# Patient Record
Sex: Male | Born: 1965 | Race: White | Hispanic: No | Marital: Single | Smoking: Never smoker
Health system: Southern US, Community
[De-identification: ages and names within clinical notes are randomized; demographics above are authoritative.]

## PROBLEM LIST (undated history)

## (undated) DIAGNOSIS — I10 Essential (primary) hypertension: Secondary | ICD-10-CM

## (undated) DIAGNOSIS — F419 Anxiety disorder, unspecified: Secondary | ICD-10-CM

## (undated) DIAGNOSIS — E785 Hyperlipidemia, unspecified: Secondary | ICD-10-CM

## (undated) DIAGNOSIS — E119 Type 2 diabetes mellitus without complications: Secondary | ICD-10-CM

## (undated) DIAGNOSIS — R569 Unspecified convulsions: Secondary | ICD-10-CM

## (undated) DIAGNOSIS — K219 Gastro-esophageal reflux disease without esophagitis: Secondary | ICD-10-CM

## (undated) HISTORY — DX: Type 2 diabetes mellitus without complications: E11.9

## (undated) HISTORY — DX: Hyperlipidemia, unspecified: E78.5

## (undated) HISTORY — DX: Gastro-esophageal reflux disease without esophagitis: K21.9

## (undated) HISTORY — DX: Anxiety disorder, unspecified: F41.9

## (undated) HISTORY — DX: Essential (primary) hypertension: I10

## (undated) HISTORY — DX: Unspecified convulsions: R56.9

## (undated) HISTORY — PX: NO PAST SURGERIES: SHX2092

---

## 2009-10-04 ENCOUNTER — Ambulatory Visit: Payer: Self-pay | Admitting: Gastroenterology

## 2010-01-01 ENCOUNTER — Ambulatory Visit: Payer: Self-pay | Admitting: Gastroenterology

## 2010-02-07 ENCOUNTER — Ambulatory Visit: Payer: Self-pay | Admitting: Gastroenterology

## 2011-07-24 ENCOUNTER — Inpatient Hospital Stay: Payer: Self-pay | Admitting: Unknown Physician Specialty

## 2012-04-22 ENCOUNTER — Ambulatory Visit: Payer: Self-pay | Admitting: Internal Medicine

## 2012-08-09 IMAGING — CR DG C-ARM 1-60 MIN
2 series · 2 of 2 positions shown · non-contrast
Comparison: none

REASON FOR EXAM: orif of rt ankle in or
COMMENTS:

[cont. (1 of 2)]
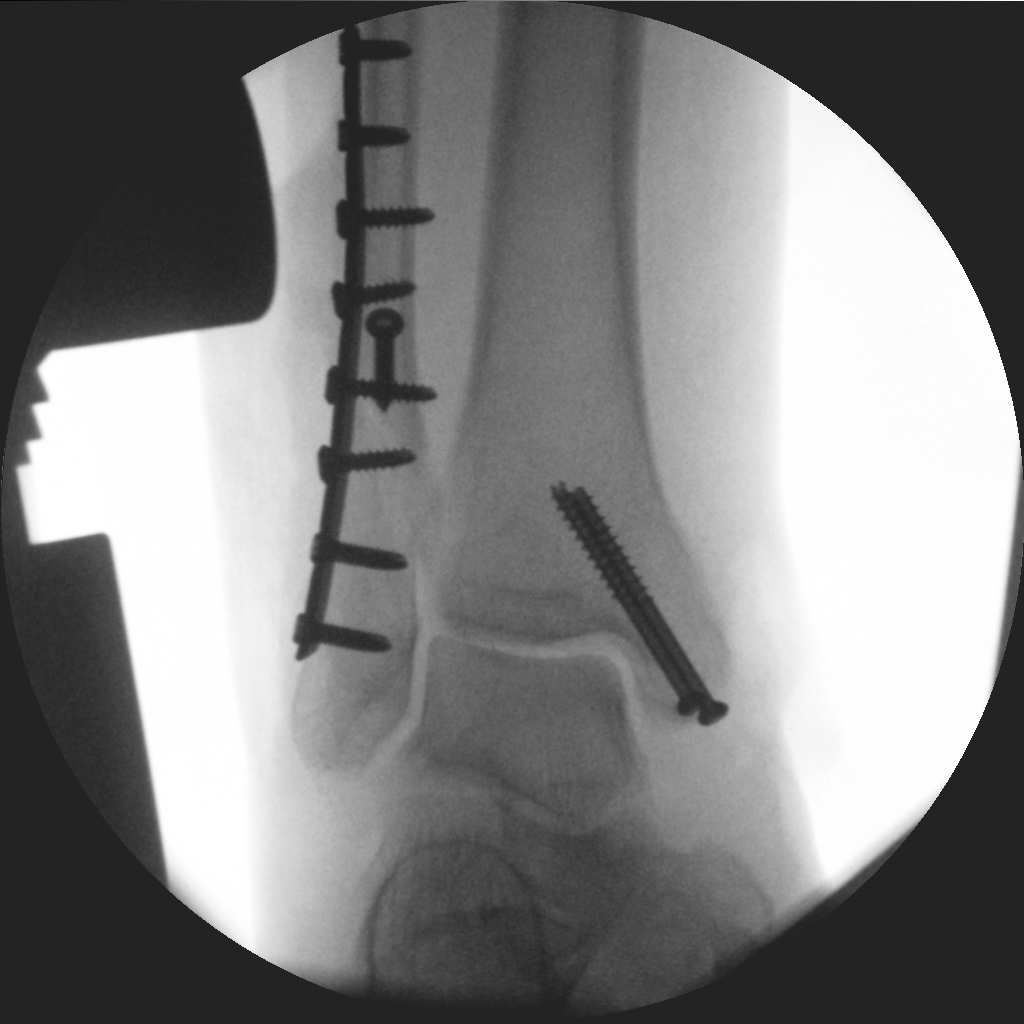

[cont. (2 of 2)]
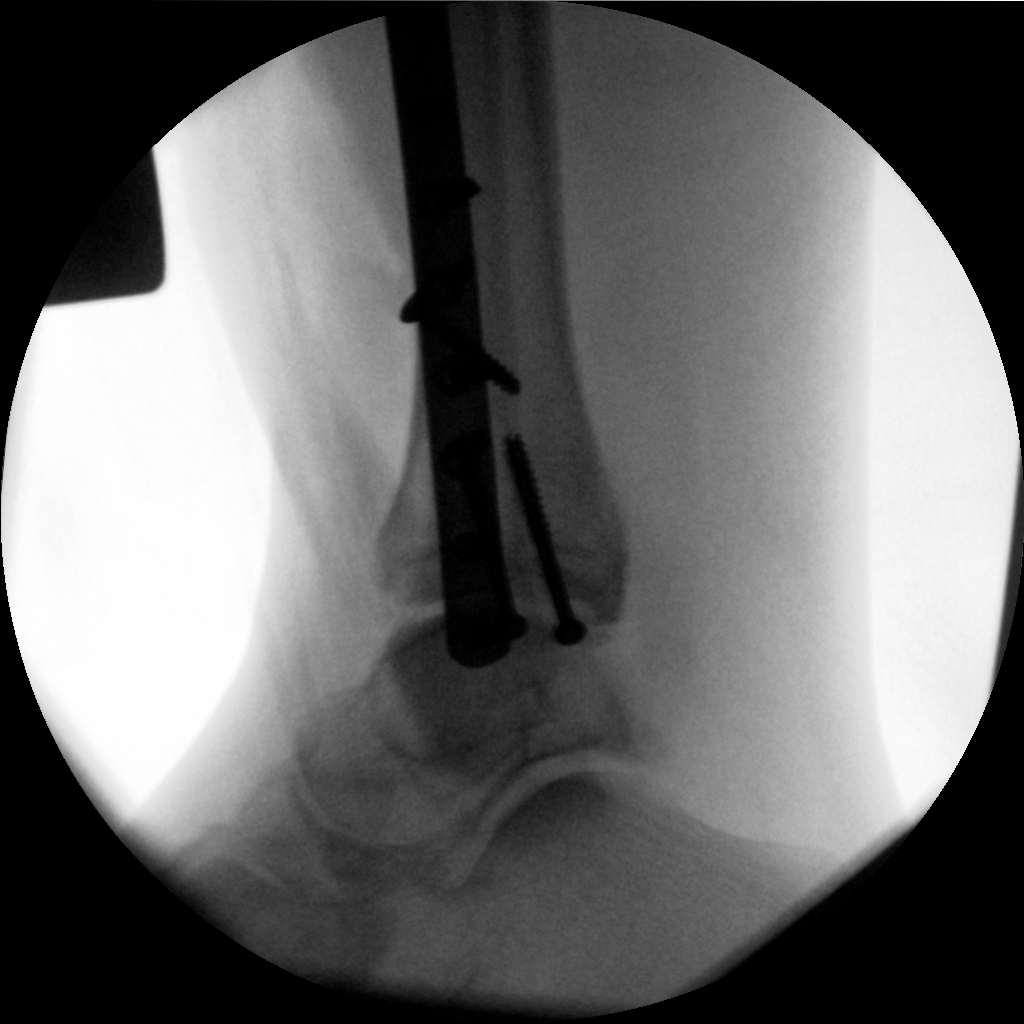

[2 of 2 positions shown; findings below may reference images not displayed]

PROCEDURE:     DXR - DXR C-ARM WITH 2 VIEWS RT ANKLE  - July 25, 2011  [DATE]

RESULT:     C-arm images demonstrate sideplate laterally in the fibula with
numerous screws present. Two screws are present from the inferior to
superior obliquely through the medial malleolus into the distal tibial
shaft. Alignment is anatomic.
IMPRESSION: ORIF of the right ankle fracture.

## 2012-12-03 ENCOUNTER — Ambulatory Visit: Payer: Self-pay

## 2013-12-19 IMAGING — CR RIGHT ANKLE - COMPLETE 3+ VIEW
1 series · 5 of 5 positions shown · non-contrast
Comparison: none

REASON FOR EXAM: swelling
COMMENTS:

[Series 1: ap · 0.17mm/px · 5 of 5 slices shown]
[im 1/5]
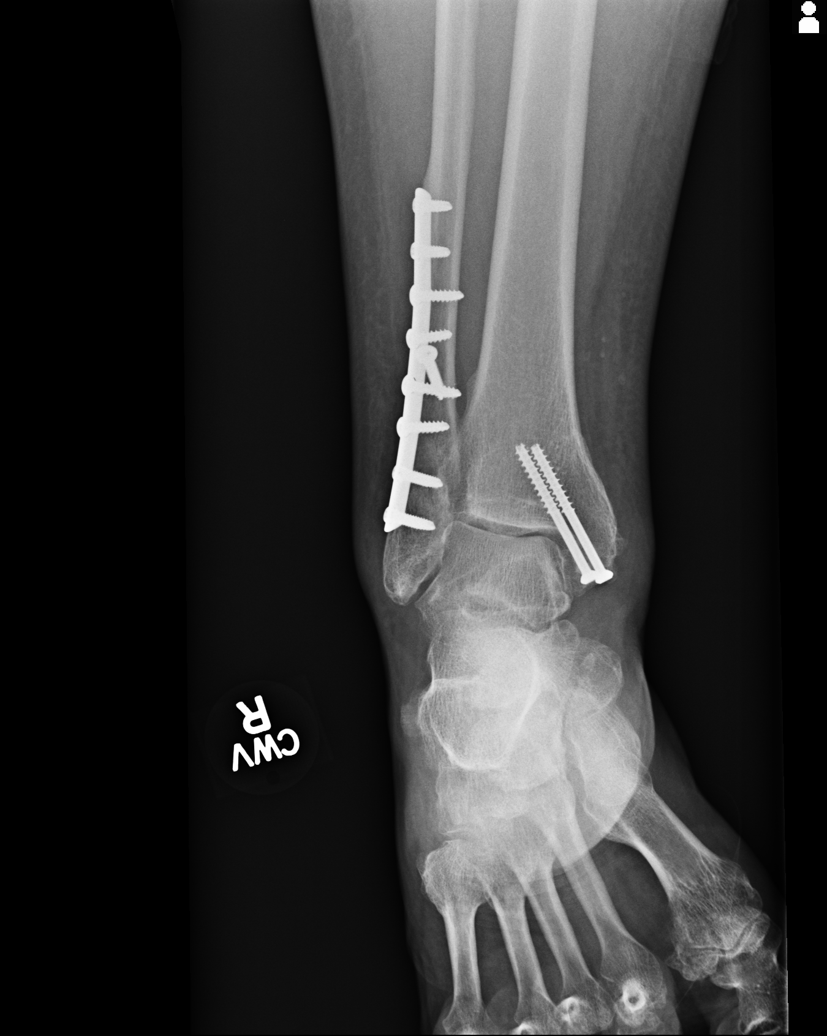
[im 2/5]
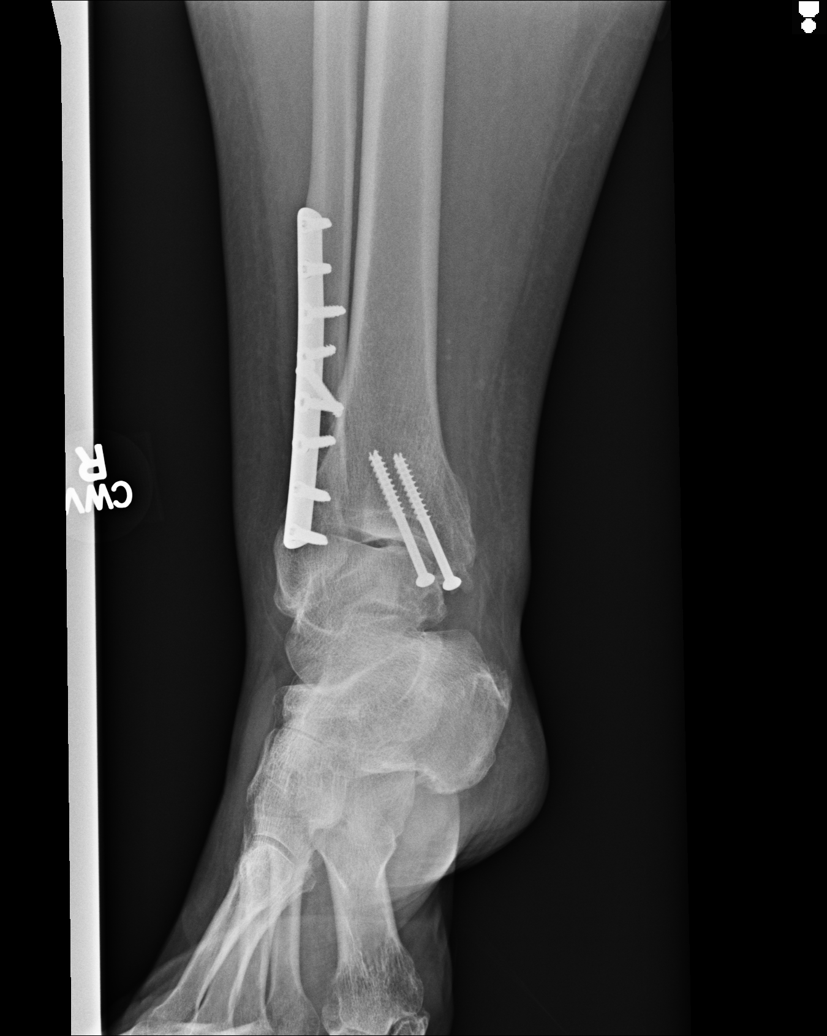
[im 3/5]
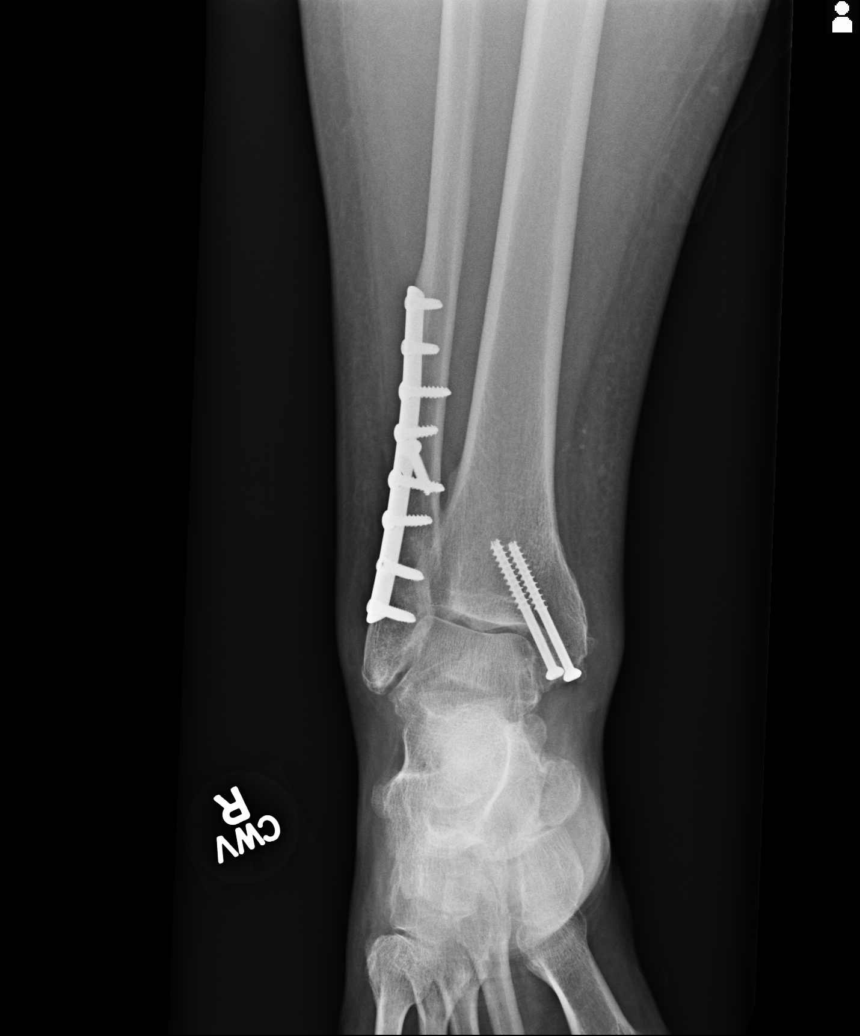
[im 4/5]
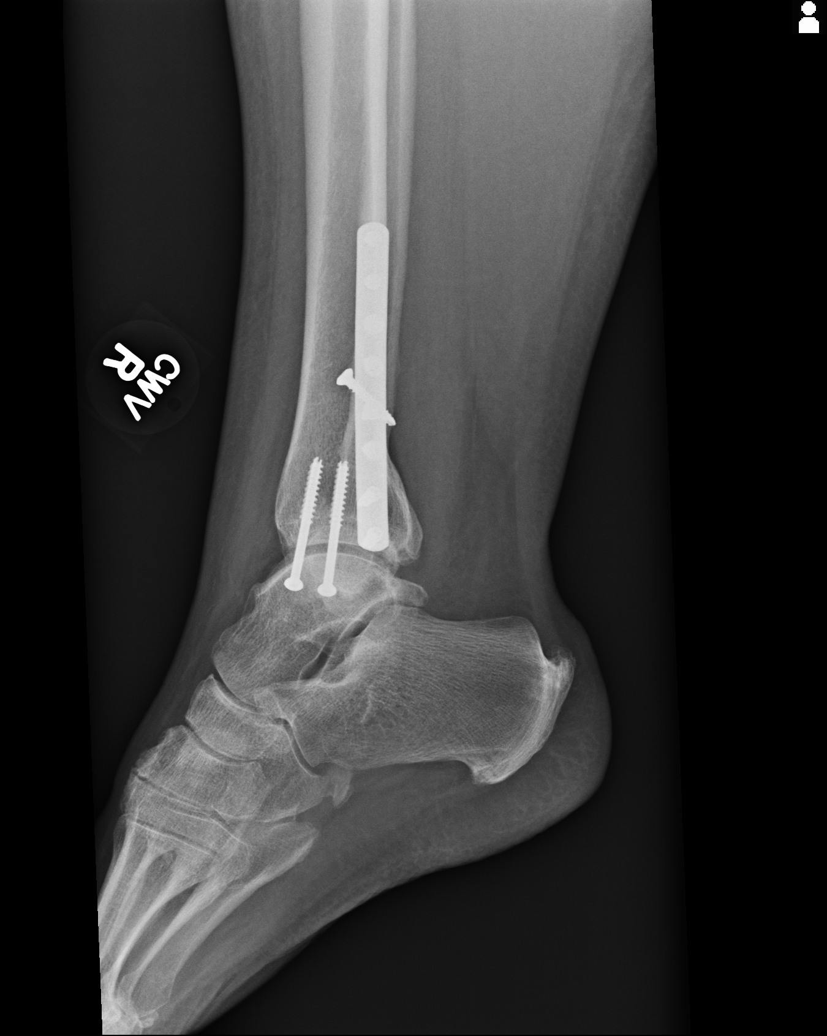
[im 5/5]
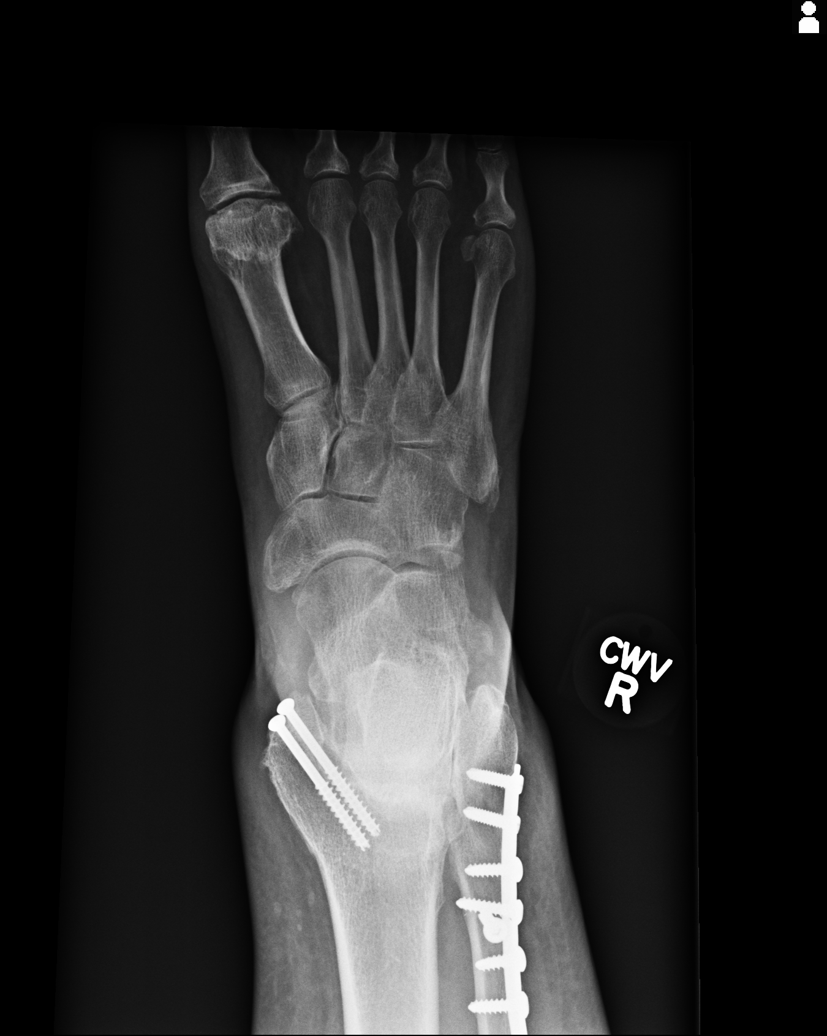

[5 of 5 positions shown; findings below may reference images not displayed]

PROCEDURE:     KDR - KDXR ANKLE RIGHT COMPLETE  - December 03, 2012  [DATE]

RESULT:     Five views of the right ankle are submitted. Comparison is made
to studies April 22, 2012.

The patient has undergone previous ORIF for a bimalleolar fracture. The
orthopedic hardware appears intact. The ankle joint mortise is preserved.
There is no evidence of abnormal loosening surrounding the cortical screws.
There is no acute fracture demonstrated. There are plantar and Achilles
region calcaneal spurs. There is mild soft tissue swelling diffusely. No
soft tissue gas is demonstrated.
IMPRESSION: There is no acute bony abnormality of the right ankle. If
the patient's symptoms persist, orthopedic evaluation would be useful.

[REDACTED]

## 2014-07-19 ENCOUNTER — Ambulatory Visit: Payer: Self-pay | Admitting: Physician Assistant

## 2016-05-09 ENCOUNTER — Other Ambulatory Visit: Payer: Self-pay | Admitting: Hematology and Oncology

## 2016-05-09 ENCOUNTER — Inpatient Hospital Stay: Payer: Medicaid Other | Admitting: Hematology and Oncology

## 2016-05-09 DIAGNOSIS — D72819 Decreased white blood cell count, unspecified: Secondary | ICD-10-CM | POA: Insufficient documentation

## 2016-05-09 NOTE — Progress Notes (Deleted)
[facility]-  Cancer Center  Clinic day:  05/09/2016  Chief Complaint: Brandon Knight is a 50 y.o. male with leukopenia who is referred by Dr. Esperanza SheetsEric Turner in consultation for assessment and management.  HPI: ***  No past medical history on file.  No past surgical history on file.  No family history on file.  Social History:  has no tobacco, alcohol, and drug history on file.  The patient is accompanied by *** alone today.  Allergies: Allergies not on file  Current Medications: No current outpatient prescriptions on file.   No current facility-administered medications for this visit.     Review of Systems:  GENERAL:  Feels good.  Active.  No fevers, sweats or weight loss. PERFORMANCE STATUS (ECOG):  *** HEENT:  No visual changes, runny nose, sore throat, mouth sores or tenderness. Lungs: No shortness of breath or cough.  No hemoptysis. Cardiac:  No chest pain, palpitations, orthopnea, or PND. GI:  No nausea, vomiting, diarrhea, constipation, melena or hematochezia. GU:  No urgency, frequency, dysuria, or hematuria. Musculoskeletal:  No back pain.  No joint pain.  No muscle tenderness. Extremities:  No pain or swelling. Skin:  No rashes or skin changes. Neuro:  No headache, numbness or weakness, balance or coordination issues. Endocrine:  No diabetes, thyroid issues, hot flashes or night sweats. Psych:  No mood changes, depression or anxiety. Pain:  No focal pain. Review of systems:  All other systems reviewed and found to be negative.   Physical Exam: There were no vitals taken for this visit. GENERAL:  Well developed, well nourished, sitting comfortably in the exam room in no acute distress. MENTAL STATUS:  Alert and oriented to person, place and time. HEAD:  *** hair.  Normocephalic, atraumatic, face symmetric, no Cushingoid features. EYES:  *** eyes.  Pupils equal round and reactive to light and accomodation.  No conjunctivitis or scleral  icterus. ENT:  Oropharynx clear without lesion.  Tongue normal. Mucous membranes moist.  RESPIRATORY:  Clear to auscultation without rales, wheezes or rhonchi. CARDIOVASCULAR:  Regular rate and rhythm without murmur, rub or gallop. ABDOMEN:  Soft, non-tender, with active bowel sounds, and no hepatosplenomegaly.  No masses. SKIN:  No rashes, ulcers or lesions. EXTREMITIES: No edema, no skin discoloration or tenderness.  No palpable cords. LYMPH NODES: No palpable cervical, supraclavicular, axillary or inguinal adenopathy  NEUROLOGICAL: Unremarkable. PSYCH:  Appropriate.  No visits with results within 3 Day(s) from this visit.  Latest known visit with results is:  No results found for any previous visit.    Assessment:  Brandon Knight is a 50 y.o. male ***  Plan: 1. *** 2. *** 3. *** 4. *** 5. ***  Rosey BathMelissa C Corcoran, MD  05/09/2016, 4:40 AM

## 2017-09-04 ENCOUNTER — Other Ambulatory Visit: Payer: Self-pay | Admitting: Nurse Practitioner

## 2017-09-05 ENCOUNTER — Encounter: Payer: Self-pay | Admitting: Nurse Practitioner

## 2017-09-05 ENCOUNTER — Ambulatory Visit (INDEPENDENT_AMBULATORY_CARE_PROVIDER_SITE_OTHER): Payer: Medicare Other | Admitting: Nurse Practitioner

## 2017-09-05 VITALS — BP 120/80 | HR 83 | Temp 96.9°F | Ht 64.0 in | Wt 193.0 lb

## 2017-09-05 DIAGNOSIS — Z8669 Personal history of other diseases of the nervous system and sense organs: Secondary | ICD-10-CM | POA: Diagnosis not present

## 2017-09-05 DIAGNOSIS — E785 Hyperlipidemia, unspecified: Secondary | ICD-10-CM | POA: Insufficient documentation

## 2017-09-05 DIAGNOSIS — F411 Generalized anxiety disorder: Secondary | ICD-10-CM | POA: Diagnosis not present

## 2017-09-05 DIAGNOSIS — D518 Other vitamin B12 deficiency anemias: Secondary | ICD-10-CM | POA: Diagnosis not present

## 2017-09-05 DIAGNOSIS — D649 Anemia, unspecified: Secondary | ICD-10-CM | POA: Insufficient documentation

## 2017-09-05 DIAGNOSIS — E1165 Type 2 diabetes mellitus with hyperglycemia: Secondary | ICD-10-CM | POA: Insufficient documentation

## 2017-09-05 DIAGNOSIS — E119 Type 2 diabetes mellitus without complications: Secondary | ICD-10-CM | POA: Insufficient documentation

## 2017-09-05 LAB — POCT GLYCOSYLATED HEMOGLOBIN (HGB A1C): Hemoglobin A1C: 5.3

## 2017-09-05 NOTE — Progress Notes (Signed)
[facility] [address] [place], [place] [address]  Internal MEDICINE  Office Visit Note  Patient Name: Brandon Knight  [id number]  [id number]  Date of Service: 09/11/2017  Complaints/HPI Pt is here for routine follow up.  The patient is here for routine follow up visit. He is diabetic, but no longer on medications to control blood sugars. Usually running below 100. Continues to get B12 injections at home. Doing well, overall with no complaints.     Current Medication: Outpatient Encounter Medications as of 09/05/2017  Medication Sig  . Calcium Carb-Cholecalciferol (CALCIUM 500 + D3) 500-600 MG-UNIT TABS Take 1 tablet by mouth.  . chlorhexidine (PERIDEX) 0.12 % solution Use as directed 15 mLs in the mouth or throat 2 (two) times daily.  . cyanocobalamin (,VITAMIN B-12,) 1000 MCG/ML injection Inject 1,000 mcg into the muscle every 30 (thirty) days.  . folic acid (FOLVITE) 1 MG tablet Take 1 mg by mouth daily.  Marland Kitchen gemfibrozil (LOPID) 600 MG tablet Take 600 mg by mouth 2 (two) times daily before a meal.  . loratadine (CLARITIN) 10 MG tablet Take 10 mg by mouth daily.  Marland Kitchen LORazepam (ATIVAN) 0.5 MG tablet Take 0.5 mg by mouth 2 (two) times daily. Take 1 tab po BID prn  . metoprolol tartrate (LOPRESSOR) 25 MG tablet Take 25 mg by mouth 2 (two) times daily.  . naproxen (EC NAPROSYN) 500 MG EC tablet Take 500 mg by mouth 2 (two) times daily with a meal.  . omeprazole (PRILOSEC) 40 MG capsule Take 40 mg by mouth daily.  Marland Kitchen PHENobarbital (LUMINAL) 32.4 MG tablet Take 32.4 mg by mouth at bedtime. Take 4 tab QHS  . phenytoin (DILANTIN) 100 MG ER capsule Take 100 mg by mouth at bedtime.  . polyethylene glycol (MIRALAX / GLYCOLAX) packet Take 17 g by mouth daily.  . sertraline (ZOLOFT) 25 MG tablet Take 25 mg by mouth daily.  Marland Kitchen thioridazine (MELLARIL) 25 MG tablet Take 25 mg by mouth daily. Take 1 tab po am given by psych  . thioridazine (MELLARIL) 50 MG tablet Take 50 mg by mouth at  bedtime. Take 1 tab qhs by psych  . ACCU-CHEK AVIVA PLUS test strip U TO CHECK BS D UTD   No facility-administered encounter medications on file as of 09/05/2017.     Surgical History: History reviewed. No pertinent surgical history.  Medical History: Past Medical History:  Diagnosis Date  . Diabetes mellitus without complication (HCC)   . Hyperlipidemia   . Seizures (HCC)     Family History: No family history on file.  Social History   Socioeconomic History  . Marital status: Single    Spouse name: Not on file  . Number of children: Not on file  . Years of education: Not on file  . Highest education level: Not on file  Social Needs  . Financial resource strain: Not on file  . Food insecurity - worry: Not on file  . Food insecurity - inability: Not on file  . Transportation needs - medical: Not on file  . Transportation needs - non-medical: Not on file  Occupational History  . Not on file  Tobacco Use  . Smoking status: Never Smoker  . Smokeless tobacco: Never Used  Substance and Sexual Activity  . Alcohol use: No    Frequency: Never  . Drug use: No  . Sexual activity: Not on file  Other Topics Concern  . Not on file  Social History Narrative  . Not  on file      Review of Systems  Constitutional: Negative for appetite change, chills, fatigue and unexpected weight change.  HENT: Negative for congestion, postnasal drip, rhinorrhea and sneezing.   Eyes: Negative.  Negative for redness.  Respiratory: Negative for apnea, cough, chest tightness, shortness of breath and wheezing.   Cardiovascular: Negative for chest pain and palpitations.  Gastrointestinal: Negative for constipation, diarrhea, nausea and vomiting.  Endocrine:       Patient's blood sugars are controlled through diet and exercise.   Genitourinary: Negative.  Negative for dysuria and frequency.  Musculoskeletal: Negative for arthralgias, back pain, joint swelling and neck pain.  Skin: Negative for  rash.  Allergic/Immunologic: Positive for environmental allergies.  Neurological: Positive for seizures. Negative for tremors and numbness.       Mild/moderate neurological deficit  Hematological: Negative for adenopathy. Does not bruise/bleed easily.  Psychiatric/Behavioral: Negative for behavioral problems (Depression), sleep disturbance and suicidal ideas. The patient is not nervous/anxious.     Vital Signs: Today's Vitals   09/05/17 1114  BP: 120/80  Pulse: 83  Temp: (!) 96.9 F (36.1 C)  Weight: 193 lb (87.5 kg)  Height: 5\' 4"  (1.626 m)    Physical Exam  Constitutional: He is oriented to person, place, and time. He appears well-developed and well-nourished.  HENT:  Head: Normocephalic.  Eyes: Pupils are equal, round, and reactive to light.  Neck: Normal range of motion. Neck supple.  Cardiovascular: Normal rate and regular rhythm.  Pulmonary/Chest: Effort normal and breath sounds normal.  Abdominal: Soft. Bowel sounds are normal. There is no tenderness.  Musculoskeletal: Normal range of motion.  Neurological: He is alert and oriented to person, place, and time.  Neurological deficit present, however, he is at his neurological baseline.   Skin: Skin is warm and dry.  Psychiatric: He has a normal mood and affect.  Nursing note and vitals reviewed.   Assessment/Plan:    ICD-10-CM   1. Type 2 diabetes mellitus with hyperglycemia, unspecified whether long term insulin use (HCC) E11.65 POCT HgB A1C  2. Other vitamin B12 deficiency anemia D51.8   3. Hyperlipidemia, unspecified hyperlipidemia type E78.5   4. History of seizure disorder Z86.69   5. GAD (generalized anxiety disorder) F41.1     1. HgbA1c 5.3 today. Continues to be controlled through diet and exercise. No changes made today 2. Continue b12 injections via home health 3. Continue gemfibrizil as prescribed 4. Patient should continue phenobarbital as prescribed to prevent seizures. 5. May use lorazepam 0.5mg  as  needed and as prescribed   Counseling:   General Counseling: I have discussed the findings of the evaluation and examination with Brandon Knight.  I have also discussed any further diagnostic evaluation that may be needed or ordered today. Brandon Knight verbalizes understanding of the findings of todays visit. We also reviewed her medications today. she has been encouraged to call the office with any questions or concerns that should arise related to todays visit.  This patient was seen by Vincent GrosHeather Jasier Calabretta, FNP- C in Collaboration with Dr Lyndon CodeFozia M Khan as a part of collaborative care agreement     Time spent: 20 min    Dr Lyndon CodeFozia M Khan Internal medicine

## 2017-09-11 ENCOUNTER — Encounter: Payer: Self-pay | Admitting: Nurse Practitioner

## 2017-09-19 LAB — PHENOBARBITAL LEVEL: Phenobarbital, Serum: 17 ug/mL (ref 15–40)

## 2017-09-19 LAB — COMPREHENSIVE METABOLIC PANEL
A/G RATIO: 1.9 (ref 1.2–2.2)
ALK PHOS: 71 IU/L (ref 39–117)
ALT: 11 IU/L (ref 0–44)
AST: 13 IU/L (ref 0–40)
Albumin: 4.6 g/dL (ref 3.5–5.5)
BUN/Creatinine Ratio: 14 (ref 9–20)
BUN: 10 mg/dL (ref 6–24)
Bilirubin Total: <0.2 mg/dL (ref 0.0–1.2)
CALCIUM: 9.3 mg/dL (ref 8.7–10.2)
CO2: 21 mmol/L (ref 20–29)
CREATININE: 0.74 mg/dL — AB (ref 0.76–1.27)
Chloride: 100 mmol/L (ref 96–106)
GFR calc Af Amer: 123 mL/min/{1.73_m2} (ref >59)
GFR, EST NON AFRICAN AMERICAN: 106 mL/min/{1.73_m2} (ref >59)
GLOBULIN, TOTAL: 2.4 g/dL (ref 1.5–4.5)
Glucose: 91 mg/dL (ref 65–99)
POTASSIUM: 4.7 mmol/L (ref 3.5–5.2)
SODIUM: 138 mmol/L (ref 134–144)
Total Protein: 7 g/dL (ref 6.0–8.5)

## 2017-09-19 LAB — LIPID PANEL W/O CHOL/HDL RATIO
CHOLESTEROL TOTAL: 147 mg/dL (ref 100–199)
HDL: 36 mg/dL — AB (ref >39)
LDL CALC: 78 mg/dL (ref 0–99)
Triglycerides: 165 mg/dL — ABNORMAL HIGH (ref 0–149)
VLDL CHOLESTEROL CAL: 33 mg/dL (ref 5–40)

## 2017-09-19 LAB — CBC
Hematocrit: 39 % (ref 37.5–51.0)
Hemoglobin: 13.7 g/dL (ref 13.0–17.7)
MCH: 32.4 pg (ref 26.6–33.0)
MCHC: 35.1 g/dL (ref 31.5–35.7)
MCV: 92 fL (ref 79–97)
PLATELETS: 198 10*3/uL (ref 150–379)
RBC: 4.23 x10E6/uL (ref 4.14–5.80)
RDW: 12.8 % (ref 12.3–15.4)
WBC: 4.3 10*3/uL (ref 3.4–10.8)

## 2017-09-19 LAB — PSA: Prostate Specific Ag, Serum: 1.5 ng/mL (ref 0.0–4.0)

## 2017-09-19 LAB — TSH: TSH: 2.25 u[IU]/mL (ref 0.450–4.500)

## 2017-09-30 ENCOUNTER — Other Ambulatory Visit: Payer: Self-pay | Admitting: Nurse Practitioner

## 2017-09-30 DIAGNOSIS — Z8669 Personal history of other diseases of the nervous system and sense organs: Secondary | ICD-10-CM

## 2017-09-30 MED ORDER — PHENOBARBITAL 32.4 MG PO TABS: 32.4000 mg | ORAL_TABLET | Freq: Every day | ORAL | 3 refills | Status: DC

## 2017-10-02 ENCOUNTER — Other Ambulatory Visit: Payer: Self-pay

## 2017-11-10 ENCOUNTER — Telehealth: Payer: Self-pay | Admitting: Internal Medicine

## 2017-11-10 NOTE — Telephone Encounter (Signed)
SIGNED PLAN OF CARE MAILED TO [email or web address] °

## 2018-01-29 ENCOUNTER — Telehealth: Payer: Self-pay

## 2018-01-29 ENCOUNTER — Other Ambulatory Visit: Payer: Self-pay

## 2018-01-29 DIAGNOSIS — Z8669 Personal history of other diseases of the nervous system and sense organs: Secondary | ICD-10-CM

## 2018-01-29 MED ORDER — PHENOBARBITAL 32.4 MG PO TABS: ORAL_TABLET | ORAL | 2 refills | Status: DC

## 2018-01-29 NOTE — Telephone Encounter (Signed)
Faxed phenobarbital 32.4 mg take 4 tabs  po qhs 112 with 2 refills with heather signature to [facility] see scan

## 2018-02-19 ENCOUNTER — Other Ambulatory Visit: Payer: Self-pay | Admitting: Internal Medicine

## 2018-02-19 DIAGNOSIS — E11649 Type 2 diabetes mellitus with hypoglycemia without coma: Secondary | ICD-10-CM

## 2018-03-06 ENCOUNTER — Encounter: Payer: Self-pay | Admitting: Nurse Practitioner

## 2018-04-10 ENCOUNTER — Ambulatory Visit: Payer: Self-pay | Admitting: Adult Health

## 2018-04-15 ENCOUNTER — Encounter: Payer: Self-pay | Admitting: Adult Health

## 2018-04-15 ENCOUNTER — Ambulatory Visit (INDEPENDENT_AMBULATORY_CARE_PROVIDER_SITE_OTHER): Payer: Medicare Other | Admitting: Adult Health

## 2018-04-15 VITALS — BP 108/60 | HR 88 | Resp 16 | Ht 64.0 in | Wt 198.4 lb

## 2018-04-15 DIAGNOSIS — E119 Type 2 diabetes mellitus without complications: Secondary | ICD-10-CM

## 2018-04-15 DIAGNOSIS — F411 Generalized anxiety disorder: Secondary | ICD-10-CM | POA: Diagnosis not present

## 2018-04-15 DIAGNOSIS — Z0001 Encounter for general adult medical examination with abnormal findings: Secondary | ICD-10-CM

## 2018-04-15 DIAGNOSIS — G40309 Generalized idiopathic epilepsy and epileptic syndromes, not intractable, without status epilepticus: Secondary | ICD-10-CM | POA: Diagnosis not present

## 2018-04-15 DIAGNOSIS — E785 Hyperlipidemia, unspecified: Secondary | ICD-10-CM | POA: Diagnosis not present

## 2018-04-15 LAB — POCT GLYCOSYLATED HEMOGLOBIN (HGB A1C): Hemoglobin A1C: 5.4 % (ref 4.0–5.6)

## 2018-04-15 NOTE — Patient Instructions (Signed)
Diabetes Mellitus and Nutrition When you have diabetes (diabetes mellitus), it is very important to have healthy eating habits because your blood sugar (glucose) levels are greatly affected by what you eat and drink. Eating healthy foods in the appropriate amounts, at about the same times every day, can help you:  Control your blood glucose.  Lower your risk of heart disease.  Improve your blood pressure.  Reach or maintain a healthy weight.  Every person with diabetes is different, and each person has different needs for a meal plan. Your health care provider may recommend that you work with a diet and nutrition specialist (dietitian) to make a meal plan that is best for you. Your meal plan may vary depending on factors such as:  The calories you need.  The medicines you take.  Your weight.  Your blood glucose, blood pressure, and cholesterol levels.  Your activity level.  Other health conditions you have, such as heart or kidney disease.  How do carbohydrates affect me? Carbohydrates affect your blood glucose level more than any other type of food. Eating carbohydrates naturally increases the amount of glucose in your blood. Carbohydrate counting is a method for keeping track of how many carbohydrates you eat. Counting carbohydrates is important to keep your blood glucose at a healthy level, especially if you use insulin or take certain oral diabetes medicines. It is important to know how many carbohydrates you can safely have in each meal. This is different for every person. Your dietitian can help you calculate how many carbohydrates you should have at each meal and for snack. Foods that contain carbohydrates include:  Bread, cereal, rice, pasta, and crackers.  Potatoes and corn.  Peas, beans, and lentils.  Milk and yogurt.  Fruit and juice.  Desserts, such as cakes, cookies, ice cream, and candy.  How does alcohol affect me? Alcohol can cause a sudden decrease in blood  glucose (hypoglycemia), especially if you use insulin or take certain oral diabetes medicines. Hypoglycemia can be a life-threatening condition. Symptoms of hypoglycemia (sleepiness, dizziness, and confusion) are similar to symptoms of having too much alcohol. If your health care provider says that alcohol is safe for you, follow these guidelines:  Limit alcohol intake to no more than 1 drink per day for nonpregnant women and 2 drinks per day for men. One drink equals 12 oz of beer, 5 oz of wine, or 1 oz of hard liquor.  Do not drink on an empty stomach.  Keep yourself hydrated with water, diet soda, or unsweetened iced tea.  Keep in mind that regular soda, juice, and other mixers may contain a lot of sugar and must be counted as carbohydrates.  What are tips for following this plan? Reading food labels  Start by checking the serving size on the label. The amount of calories, carbohydrates, fats, and other nutrients listed on the label are based on one serving of the food. Many foods contain more than one serving per package.  Check the total grams (g) of carbohydrates in one serving. You can calculate the number of servings of carbohydrates in one serving by dividing the total carbohydrates by 15. For example, if a food has 30 g of total carbohydrates, it would be equal to 2 servings of carbohydrates.  Check the number of grams (g) of saturated and trans fats in one serving. Choose foods that have low or no amount of these fats.  Check the number of milligrams (mg) of sodium in one serving. Most people   should limit total sodium intake to less than 2,300 mg per day.  Always check the nutrition information of foods labeled as "low-fat" or "nonfat". These foods may be higher in added sugar or refined carbohydrates and should be avoided.  Talk to your dietitian to identify your daily goals for nutrients listed on the label. Shopping  Avoid buying canned, premade, or processed foods. These  foods tend to be high in fat, sodium, and added sugar.  Shop around the outside edge of the grocery store. This includes fresh fruits and vegetables, bulk grains, fresh meats, and fresh dairy. Cooking  Use low-heat cooking methods, such as baking, instead of high-heat cooking methods like deep frying.  Cook using healthy oils, such as olive, canola, or sunflower oil.  Avoid cooking with butter, cream, or high-fat meats. Meal planning  Eat meals and snacks regularly, preferably at the same times every day. Avoid going long periods of time without eating.  Eat foods high in fiber, such as fresh fruits, vegetables, beans, and whole grains. Talk to your dietitian about how many servings of carbohydrates you can eat at each meal.  Eat 4-6 ounces of lean protein each day, such as lean meat, chicken, fish, eggs, or tofu. 1 ounce is equal to 1 ounce of meat, chicken, or fish, 1 egg, or 1/4 cup of tofu.  Eat some foods each day that contain healthy fats, such as avocado, nuts, seeds, and fish. Lifestyle   Check your blood glucose regularly.  Exercise at least 30 minutes 5 or more days each week, or as told by your health care provider.  Take medicines as told by your health care provider.  Do not use any products that contain nicotine or tobacco, such as cigarettes and e-cigarettes. If you need help quitting, ask your health care provider.  Work with a counselor or diabetes educator to identify strategies to manage stress and any emotional and social challenges. What are some questions to ask my health care provider?  Do I need to meet with a diabetes educator?  Do I need to meet with a dietitian?  What number can I call if I have questions?  When are the best times to check my blood glucose? Where to find more information:  American Diabetes Association: diabetes.org/food-and-fitness/food  Academy of Nutrition and Dietetics:  www.eatright.org/resources/health/diseases-and-conditions/diabetes  National Institute of Diabetes and Digestive and Kidney Diseases (NIH): www.niddk.nih.gov/health-information/diabetes/overview/diet-eating-physical-activity Summary  A healthy meal plan will help you control your blood glucose and maintain a healthy lifestyle.  Working with a diet and nutrition specialist (dietitian) can help you make a meal plan that is best for you.  Keep in mind that carbohydrates and alcohol have immediate effects on your blood glucose levels. It is important to count carbohydrates and to use alcohol carefully. This information is not intended to replace advice given to you by your health care provider. Make sure you discuss any questions you have with your health care provider. Document Released: 05/16/2005 Document Revised: 09/23/2016 Document Reviewed: 09/23/2016 Elsevier Interactive Patient Education  2018 [employer]  

## 2018-04-15 NOTE — Progress Notes (Signed)
[facility] [address] [place], [place] [address]  Internal MEDICINE  Office Visit Note  Patient Name: Brandon Knight  [id number]  [id number]  Date of Service: 05/04/2018  Chief Complaint  Patient presents with  . Annual Exam  . Hyperlipidemia  . Seizures  . Quality Metric Gaps    MICROALBUNIM/ PNEUMONIA SHOT  . Diabetes    HPI Pt here for AWV and  routine follow up for his multiple medical problems. Care giver in exam room.  She denies recent seizures, and states he has been doing well overall.  His appetite is good and he denies complaints. Pt does not answer questions, he repeats with slurred speech what the provider is saying.  [place] lives in [place] and has been since he was 52yo.     Current Medication: Outpatient Encounter Medications as of 04/15/2018  Medication Sig  . ACCU-CHEK AVIVA PLUS test strip USE TO CHECK BLOOD SUGAR DAILY AS DIRECTED  . Calcium Carb-Cholecalciferol (CALCIUM 500 + D3) 500-600 MG-UNIT TABS Take 1 tablet by mouth.  . chlorhexidine (PERIDEX) 0.12 % solution Use as directed 15 mLs in the mouth or throat 2 (two) times daily.  . cyanocobalamin (,VITAMIN B-12,) 1000 MCG/ML injection Inject 1,000 mcg into the muscle every 30 (thirty) days.  . folic acid (FOLVITE) 1 MG tablet Take 1 mg by mouth daily.  Marland Kitchen gemfibrozil (LOPID) 600 MG tablet Take 600 mg by mouth 2 (two) times daily before a meal.  . loratadine (CLARITIN) 10 MG tablet Take 10 mg by mouth daily.  Marland Kitchen LORazepam (ATIVAN) 0.5 MG tablet Take 0.5 mg by mouth 2 (two) times daily. Take 1 tab po BID prn  . metoprolol tartrate (LOPRESSOR) 25 MG tablet Take 25 mg by mouth 2 (two) times daily.  . naproxen (EC NAPROSYN) 500 MG EC tablet Take 500 mg by mouth 2 (two) times daily with a meal.  . omeprazole (PRILOSEC) 40 MG capsule Take 40 mg by mouth daily.  Marland Kitchen PHENobarbital (LUMINAL) 32.4 MG tablet Take 4 tab QHS  . phenytoin (DILANTIN) 100 MG ER capsule Take 100 mg by mouth at bedtime.  .  polyethylene glycol (MIRALAX / GLYCOLAX) packet Take 17 g by mouth daily.  . sertraline (ZOLOFT) 25 MG tablet Take 25 mg by mouth daily.  Marland Kitchen thioridazine (MELLARIL) 25 MG tablet Take 25 mg by mouth daily. Take 1 tab po am given by psych  . thioridazine (MELLARIL) 50 MG tablet Take 50 mg by mouth at bedtime. Take 1 tab qhs by psych   No facility-administered encounter medications on file as of 04/15/2018.     Surgical History: History reviewed. No pertinent surgical history.  Medical History: Past Medical History:  Diagnosis Date  . Diabetes mellitus without complication (HCC)   . Hyperlipidemia   . Seizures (HCC)     Family History: Family History  Family history unknown: Yes    Social History   Socioeconomic History  . Marital status: Single    Spouse name: Not on file  . Number of children: Not on file  . Years of education: Not on file  . Highest education level: Not on file  Occupational History  . Not on file  Social Needs  . Financial resource strain: Not on file  . Food insecurity:    Worry: Not on file    Inability: Not on file  . Transportation needs:    Medical: Not on file    Non-medical: Not on file  Tobacco Use  .  Smoking status: Never Smoker  . Smokeless tobacco: Never Used  Substance and Sexual Activity  . Alcohol use: No    Frequency: Never  . Drug use: No  . Sexual activity: Not on file  Lifestyle  . Physical activity:    Days per week: Not on file    Minutes per session: Not on file  . Stress: Not on file  Relationships  . Social connections:    Talks on phone: Not on file    Gets together: Not on file    Attends religious service: Not on file    Active member of club or organization: Not on file    Attends meetings of clubs or organizations: Not on file    Relationship status: Not on file  . Intimate partner violence:    Fear of current or ex partner: Not on file    Emotionally abused: Not on file    Physically abused: Not on file     Forced sexual activity: Not on file  Other Topics Concern  . Not on file  Social History Narrative  . Not on file   Review of Systems  Constitutional: Negative.  Negative for chills, fatigue and unexpected weight change.  HENT: Negative.  Negative for congestion, rhinorrhea, sneezing and sore throat.   Eyes: Negative for redness.  Respiratory: Negative.  Negative for cough, chest tightness and shortness of breath.   Cardiovascular: Negative.  Negative for chest pain and palpitations.  Gastrointestinal: Negative.  Negative for abdominal pain, constipation, diarrhea, nausea and vomiting.  Endocrine: Negative.   Genitourinary: Negative.  Negative for dysuria and frequency.  Musculoskeletal: Negative.  Negative for arthralgias, back pain, joint swelling and neck pain.  Skin: Negative.  Negative for rash.  Allergic/Immunologic: Negative.   Neurological: Negative.  Negative for tremors and numbness.  Hematological: Negative for adenopathy. Does not bruise/bleed easily.  Psychiatric/Behavioral: Negative.  Negative for behavioral problems, sleep disturbance and suicidal ideas. The patient is not nervous/anxious.    Vital Signs: BP 108/60   Pulse 88   Resp 16   Ht 5\' 4"  (1.626 m)   Wt 198 lb 6.4 oz (90 kg)   SpO2 97%   BMI 34.06 kg/m  Physical Exam  Constitutional: He is oriented to person, place, and time. He appears well-developed and well-nourished. No distress.  HENT:  Head: Normocephalic and atraumatic.  Mouth/Throat: Oropharynx is clear and moist. No oropharyngeal exudate.  Eyes: Pupils are equal, round, and reactive to light. EOM are normal.  Neck: Normal range of motion. Neck supple. No JVD present. No tracheal deviation present. No thyromegaly present.  Cardiovascular: Normal rate, regular rhythm and normal heart sounds. Exam reveals no gallop and no friction rub.  No murmur heard. Pulmonary/Chest: Effort normal and breath sounds normal. No respiratory distress. He has no  wheezes. He has no rales. He exhibits no tenderness.  Abdominal: Soft. There is no tenderness. There is no guarding.  Musculoskeletal: Normal range of motion.  Lymphadenopathy:    He has no cervical adenopathy.  Neurological: He is alert and oriented to person, place, and time. No cranial nerve deficit.  Skin: Skin is warm and dry. He is not diaphoretic.  Psychiatric: He has a normal mood and affect. His behavior is normal. Judgment and thought content normal.  Nursing note and vitals reviewed.   Assessment/Plan: 1. Diabetes mellitus without complication (HCC) - Stable and diet controlled  - POCT HgB A1C  2. Nonintractable generalized idiopathic epilepsy without status epilepticus (HCC) - to  continue on pheno and dilantin, will need follow up labs for therapeutic levels   3. Hyperlipidemia, unspecified hyperlipidemia type - Controlled   4. Encounter for general adult medical examination with abnormal findings - Pt is up to date and lacks some due to unable to make a decision  5. GAD (generalized anxiety disorder) - Continue Zoloft   General Counseling: Itzael verbalizes understanding of the findings of todays visit and agrees with plan of treatment. I have discussed any further diagnostic evaluation that may be needed or ordered today. We also reviewed his medications today. he has been encouraged to call the office with any questions or concerns that should arise related to todays visit.    Orders Placed This Encounter  Procedures  . POCT HgB A1C     Time spent: 25 Minutes   This patient was seen by Blima LedgerAdam Raigan Baria AGNP-C in Collaboration with Dr Lyndon CodeFozia M Khan as a part of collaborative care agreement    Dr Lyndon CodeFozia M Khan Internal medicine

## 2018-05-08 ENCOUNTER — Telehealth: Payer: Self-pay | Admitting: Nurse Practitioner

## 2018-05-08 ENCOUNTER — Other Ambulatory Visit: Payer: Self-pay | Admitting: Nurse Practitioner

## 2018-05-08 DIAGNOSIS — Z8669 Personal history of other diseases of the nervous system and sense organs: Secondary | ICD-10-CM

## 2018-05-08 MED ORDER — PHENOBARBITAL 32.4 MG PO TABS: ORAL_TABLET | ORAL | 2 refills | Status: DC

## 2018-05-08 NOTE — Progress Notes (Signed)
Renewed phenobarbital per request. New rx sent to [facility] pharmacy.

## 2018-05-08 NOTE — Telephone Encounter (Signed)
Renewed phenobarbital per request. [facility]  

## 2018-06-19 ENCOUNTER — Telehealth: Payer: Self-pay | Admitting: Internal Medicine

## 2018-06-19 NOTE — Telephone Encounter (Signed)
SIGNED PLAN OF CARE PUT INTO [facility]IN[facility]ED FOLDER TO BE PICKED UP.JW °

## 2018-07-16 ENCOUNTER — Encounter: Payer: Self-pay | Admitting: Nurse Practitioner

## 2018-07-16 ENCOUNTER — Ambulatory Visit (INDEPENDENT_AMBULATORY_CARE_PROVIDER_SITE_OTHER): Payer: Medicare Other | Admitting: Nurse Practitioner

## 2018-07-16 VITALS — BP 120/68 | HR 67 | Resp 16 | Ht 64.0 in | Wt 209.8 lb

## 2018-07-16 DIAGNOSIS — E785 Hyperlipidemia, unspecified: Secondary | ICD-10-CM

## 2018-07-16 DIAGNOSIS — E119 Type 2 diabetes mellitus without complications: Secondary | ICD-10-CM

## 2018-07-16 DIAGNOSIS — Z8669 Personal history of other diseases of the nervous system and sense organs: Secondary | ICD-10-CM | POA: Diagnosis not present

## 2018-07-16 DIAGNOSIS — E1165 Type 2 diabetes mellitus with hyperglycemia: Secondary | ICD-10-CM

## 2018-07-16 DIAGNOSIS — I10 Essential (primary) hypertension: Secondary | ICD-10-CM | POA: Diagnosis not present

## 2018-07-16 LAB — POCT GLYCOSYLATED HEMOGLOBIN (HGB A1C): Hemoglobin A1C: 6.1 % — AB (ref 4.0–5.6)

## 2018-07-16 MED ORDER — PHENOBARBITAL 32.4 MG PO TABS: ORAL_TABLET | ORAL | 3 refills | Status: DC

## 2018-07-16 NOTE — Progress Notes (Signed)
[facility] [address] [place], [place] [address]  Internal MEDICINE  Office Visit Note  Patient Name: Brandon Knight  [id number]  [id number]  Date of Service: 07/19/2018  Chief Complaint  Patient presents with  . Medical Management of Chronic Issues    3 month follow up  . Hyperlipidemia  . Diabetes    The patient is here for routine follow up exam. He is accompanied by caregiver. She monitors his blood sugars and has noted that they have been increasing recently. They are running from 130s to 160s. Unclear if these are fasting or not. Patient feels good, and otherwise, there are no changes to his status. He has no concerns or complaints. The patient needs to have FL2 forms and careplans completed and signed. He also needs to have a refill of his phenobarbital.       Current Medication: Outpatient Encounter Medications as of 07/16/2018  Medication Sig  . ACCU-CHEK AVIVA PLUS test strip USE TO CHECK BLOOD SUGAR DAILY AS DIRECTED  . Calcium Carb-Cholecalciferol (CALCIUM 500 + D3) 500-600 MG-UNIT TABS Take 1 tablet by mouth.  . chlorhexidine (PERIDEX) 0.12 % solution Use as directed 15 mLs in the mouth or throat 2 (two) times daily.  . cyanocobalamin (,VITAMIN B-12,) 1000 MCG/ML injection Inject 1,000 mcg into the muscle every 30 (thirty) days.  . folic acid (FOLVITE) 1 MG tablet Take 1 mg by mouth daily.  Marland Kitchen gemfibrozil (LOPID) 600 MG tablet Take 600 mg by mouth 2 (two) times daily before a meal.  . loratadine (CLARITIN) 10 MG tablet Take 10 mg by mouth daily.  Marland Kitchen LORazepam (ATIVAN) 0.5 MG tablet Take 0.5 mg by mouth 2 (two) times daily. Take 1 tab po BID prn  . metoprolol tartrate (LOPRESSOR) 25 MG tablet Take 25 mg by mouth 2 (two) times daily.  . naproxen (EC NAPROSYN) 500 MG EC tablet Take 500 mg by mouth 2 (two) times daily with a meal.  . omeprazole (PRILOSEC) 40 MG capsule Take 40 mg by mouth daily.  Marland Kitchen PHENobarbital (LUMINAL) 32.4 MG tablet Take 4 tab QHS   . phenytoin (DILANTIN) 100 MG ER capsule Take 100 mg by mouth at bedtime.  . polyethylene glycol (MIRALAX / GLYCOLAX) packet Take 17 g by mouth daily.  . sertraline (ZOLOFT) 25 MG tablet Take 25 mg by mouth daily.  Marland Kitchen thioridazine (MELLARIL) 25 MG tablet Take 25 mg by mouth daily. Take 1 tab po am given by psych  . thioridazine (MELLARIL) 50 MG tablet Take 50 mg by mouth at bedtime. Take 1 tab qhs by psych  . [DISCONTINUED] PHENobarbital (LUMINAL) 32.4 MG tablet Take 4 tab QHS   No facility-administered encounter medications on file as of 07/16/2018.     Surgical History: History reviewed. No pertinent surgical history.  Medical History: Past Medical History:  Diagnosis Date  . Diabetes mellitus without complication (HCC)   . Hyperlipidemia   . Seizures (HCC)     Family History: Family History  Family history unknown: Yes    Social History   Socioeconomic History  . Marital status: Single    Spouse name: Not on file  . Number of children: Not on file  . Years of education: Not on file  . Highest education level: Not on file  Occupational History  . Not on file  Social Needs  . Financial resource strain: Not on file  . Food insecurity:    Worry: Not on file    Inability: Not on  file  . Transportation needs:    Medical: Not on file    Non-medical: Not on file  Tobacco Use  . Smoking status: Never Smoker  . Smokeless tobacco: Never Used  Substance and Sexual Activity  . Alcohol use: No    Frequency: Never  . Drug use: No  . Sexual activity: Not on file  Lifestyle  . Physical activity:    Days per week: Not on file    Minutes per session: Not on file  . Stress: Not on file  Relationships  . Social connections:    Talks on phone: Not on file    Gets together: Not on file    Attends religious service: Not on file    Active member of club or organization: Not on file    Attends meetings of clubs or organizations: Not on file    Relationship status: Not on  file  . Intimate partner violence:    Fear of current or ex partner: Not on file    Emotionally abused: Not on file    Physically abused: Not on file    Forced sexual activity: Not on file  Other Topics Concern  . Not on file  Social History Narrative  . Not on file      Review of Systems  Constitutional: Negative for chills, fatigue and unexpected weight change.  HENT: Negative for congestion, postnasal drip, rhinorrhea, sneezing and sore throat.   Respiratory: Negative for cough, chest tightness, shortness of breath and wheezing.   Cardiovascular: Negative for chest pain and palpitations.  Gastrointestinal: Negative for abdominal pain, constipation, diarrhea, nausea and vomiting.  Endocrine: Negative for cold intolerance, heat intolerance, polydipsia and polyphagia.  Musculoskeletal: Negative for arthralgias, back pain, joint swelling and neck pain.  Skin: Negative for rash.  Allergic/Immunologic: Negative for environmental allergies.  Neurological: Positive for seizures. Negative for tremors and numbness.  Hematological: Negative for adenopathy. Does not bruise/bleed easily.  Psychiatric/Behavioral: Negative for behavioral problems (Depression), sleep disturbance and suicidal ideas. The patient is nervous/anxious.    Today's Vitals   07/16/18 1119  BP: 120/68  Pulse: 67  Resp: 16  SpO2: 96%  Weight: 209 lb 12.8 oz (95.2 kg)  Height: 5\' 4"  (1.626 m)    Physical Exam  Constitutional: He is oriented to person, place, and time. He appears well-developed and well-nourished. No distress.  HENT:  Head: Normocephalic and atraumatic.  Mouth/Throat: No oropharyngeal exudate.  Eyes: Pupils are equal, round, and reactive to light. EOM are normal.  Neck: Normal range of motion. Neck supple. No JVD present. No tracheal deviation present. No thyromegaly present.  Cardiovascular: Normal rate, regular rhythm and normal heart sounds. Exam reveals no gallop and no friction rub.  No  murmur heard. Pulmonary/Chest: Effort normal and breath sounds normal. No respiratory distress. He has no wheezes. He has no rales. He exhibits no tenderness.  Abdominal: Soft. Bowel sounds are normal.  Musculoskeletal: Normal range of motion.  Lymphadenopathy:    He has no cervical adenopathy.  Neurological: He is alert and oriented to person, place, and time. No cranial nerve deficit.  The patient is at his neurological baseline.   Skin: Skin is warm and dry. He is not diaphoretic.  Psychiatric: His behavior is normal. Judgment and thought content normal. His mood appears anxious.  Nursing note and vitals reviewed.  Assessment/Plan: 1. Type 2 diabetes mellitus without complication, without long-term current use of insulin (HCC) - POCT HgB A1C 6.1 today. Blood sugars continued to be  controlled through diet and exercise. Continue to monitor blood sugars at home and bring log to next visit. Will recheck HgbA1c at next visit.   2. Essential hypertension Stable. Continue BP medication as prescribed.  3. Hyperlipidemia, unspecified hyperlipidemia type Continue cholesterol medication as prescribed   4. History of seizure disorder Continue phenobarbital as prescribed. Refills were sent to his pharmacy.  - PHENobarbital (LUMINAL) 32.4 MG tablet; Take 4 tab QHS  Dispense: 120 tablet; Refill: 3  General Counseling: Gatlyn verbalizes understanding of the findings of todays visit and agrees with plan of treatment. I have discussed any further diagnostic evaluation that may be needed or ordered today. We also reviewed his medications today. he has been encouraged to call the office with any questions or concerns that should arise related to todays visit.  Diabetes Counseling:  1. Addition of ACE inh/ ARB'S for nephroprotection. Microalbumin is updated  2. Diabetic foot care, prevention of complications. Podiatry consult 3. Exercise and lose weight.  4. Diabetic eye examination, Diabetic eye exam  is updated  5. Monitor blood sugar closlely. nutrition counseling.  6. Sign and symptoms of hypoglycemia including shaking sweating,confusion and headaches.   This patient was seen by Vincent Gros FNP Collaboration with Dr Lyndon Code as a part of collaborative care agreement  [facility] and care plan forms were reviewed, signed, and given back to caregiver.   Orders Placed This Encounter  Procedures  . POCT HgB A1C    Meds ordered this encounter  Medications  . PHENobarbital (LUMINAL) 32.4 MG tablet    Sig: Take 4 tab QHS    Dispense:  120 tablet    Refill:  3    Order Specific Question:   Supervising Provider    Answer:   Lyndon Code [[id number]]    Time spent: 43 Minutes      Dr Lyndon Code Internal medicine

## 2018-07-19 DIAGNOSIS — I10 Essential (primary) hypertension: Secondary | ICD-10-CM | POA: Insufficient documentation

## 2018-08-18 ENCOUNTER — Other Ambulatory Visit: Payer: Self-pay | Admitting: Internal Medicine

## 2018-08-18 ENCOUNTER — Other Ambulatory Visit: Payer: Self-pay | Admitting: Nurse Practitioner

## 2018-08-18 DIAGNOSIS — Z8669 Personal history of other diseases of the nervous system and sense organs: Secondary | ICD-10-CM

## 2018-08-18 MED ORDER — PHENYTOIN SODIUM EXTENDED 100 MG PO CAPS: 100.0000 mg | ORAL_CAPSULE | Freq: Every day | ORAL | 5 refills | Status: DC

## 2018-08-18 NOTE — Telephone Encounter (Signed)
Can you send

## 2018-08-18 NOTE — Progress Notes (Signed)
Filled prescription for dilantin and [facility] per pharmacy request.

## 2018-09-08 ENCOUNTER — Other Ambulatory Visit: Payer: Self-pay

## 2018-09-08 DIAGNOSIS — E11649 Type 2 diabetes mellitus with hypoglycemia without coma: Secondary | ICD-10-CM

## 2018-09-08 MED ORDER — GLUCOSE BLOOD VI STRP: ORAL_STRIP | 3 refills | Status: DC

## 2018-09-17 ENCOUNTER — Telehealth: Payer: Self-pay

## 2018-09-17 ENCOUNTER — Other Ambulatory Visit: Payer: Self-pay | Admitting: Nurse Practitioner

## 2018-09-17 DIAGNOSIS — Z8669 Personal history of other diseases of the nervous system and sense organs: Secondary | ICD-10-CM

## 2018-09-17 MED ORDER — PHENYTOIN SODIUM EXTENDED 100 MG PO CAPS: 400.0000 mg | ORAL_CAPSULE | Freq: Every day | ORAL | 3 refills | Status: DC

## 2018-09-17 NOTE — Telephone Encounter (Signed)
Changed prescription to four capsules daily. [facility]  

## 2018-09-17 NOTE — Progress Notes (Signed)
Changed prescription to four capsules daily. Sent new rx to [facility]

## 2018-11-16 ENCOUNTER — Encounter: Payer: Self-pay | Admitting: Nurse Practitioner

## 2018-11-16 ENCOUNTER — Ambulatory Visit: Payer: Medicare Other | Admitting: Nurse Practitioner

## 2018-11-16 ENCOUNTER — Other Ambulatory Visit: Payer: Self-pay

## 2018-11-16 VITALS — BP 122/70 | HR 81 | Resp 16 | Ht 64.0 in | Wt 211.8 lb

## 2018-11-16 DIAGNOSIS — I10 Essential (primary) hypertension: Secondary | ICD-10-CM | POA: Diagnosis not present

## 2018-11-16 DIAGNOSIS — E1165 Type 2 diabetes mellitus with hyperglycemia: Secondary | ICD-10-CM

## 2018-11-16 DIAGNOSIS — Z8669 Personal history of other diseases of the nervous system and sense organs: Secondary | ICD-10-CM | POA: Diagnosis not present

## 2018-11-16 LAB — POCT GLYCOSYLATED HEMOGLOBIN (HGB A1C): HEMOGLOBIN A1C: 6.8 % — AB (ref 4.0–5.6)

## 2018-11-16 NOTE — Progress Notes (Signed)
[facility] [address] [place], [place] [address]  Internal MEDICINE  Office Visit Note  Patient Name: Brandon Knight  [id number]  [id number]  Date of Service: 11/16/2018  Chief Complaint  Patient presents with  . Medical Management of Chronic Issues    4 month follow up  . Diabetes    A1C    The patient is here for routine follow up exam. He is accompanied by caregiver. She monitors his blood sugars and has noted that they have been increasing recently. They are running from 130s to 160s. Unclear if these are fasting or not. HgbA1c is 6.8 today, up from 6.1 at most recent check.  Patient feels good, and otherwise, there are no changes to his status. He has no concerns or complaints.      Current Medication: Outpatient Encounter Medications as of 11/16/2018  Medication Sig  . Calcium Carb-Cholecalciferol (CALCIUM 500 + D3) 500-600 MG-UNIT TABS Take 1 tablet by mouth.  . chlorhexidine (PERIDEX) 0.12 % solution Use as directed 15 mLs in the mouth or throat 2 (two) times daily.  . cyanocobalamin (,VITAMIN B-12,) 1000 MCG/ML injection Inject 1,000 mcg into the muscle every 30 (thirty) days.  . folic acid (FOLVITE) 1 MG tablet Take 1 mg by mouth daily.  Marland Kitchen gemfibrozil (LOPID) 600 MG tablet Take 600 mg by mouth 2 (two) times daily before a meal.  . glucose blood (ACCU-CHEK AVIVA PLUS) test strip USE TO CHECK BLOOD SUGAR DAILY AS DIRECTED  . loratadine (CLARITIN) 10 MG tablet Take 10 mg by mouth daily.  Marland Kitchen LORazepam (ATIVAN) 0.5 MG tablet Take 0.5 mg by mouth 2 (two) times daily. Take 1 tab po BID prn  . metoprolol tartrate (LOPRESSOR) 25 MG tablet Take 25 mg by mouth 2 (two) times daily.  . naproxen (EC NAPROSYN) 500 MG EC tablet Take 500 mg by mouth 2 (two) times daily with a meal.  . omeprazole (PRILOSEC) 40 MG capsule Take 40 mg by mouth daily.  Marland Kitchen PHENobarbital (LUMINAL) 32.4 MG tablet Take 4 tab QHS  . phenytoin (DILANTIN) 100 MG ER capsule Take 4 capsules (400 mg  total) by mouth at bedtime.  . polyethylene glycol (MIRALAX / GLYCOLAX) packet Take 17 g by mouth daily.  . sertraline (ZOLOFT) 25 MG tablet Take 25 mg by mouth daily.  Marland Kitchen thioridazine (MELLARIL) 25 MG tablet Take 25 mg by mouth daily. Take 1 tab po am given by psych  . thioridazine (MELLARIL) 50 MG tablet Take 50 mg by mouth at bedtime. Take 1 tab qhs by psych   No facility-administered encounter medications on file as of 11/16/2018.     Surgical History: History reviewed. No pertinent surgical history.  Medical History: Past Medical History:  Diagnosis Date  . Diabetes mellitus without complication (HCC)   . Hyperlipidemia   . Seizures (HCC)     Family History: Family History  Family history unknown: Yes    Social History   Socioeconomic History  . Marital status: Single    Spouse name: Not on file  . Number of children: Not on file  . Years of education: Not on file  . Highest education level: Not on file  Occupational History  . Not on file  Social Needs  . Financial resource strain: Not on file  . Food insecurity:    Worry: Not on file    Inability: Not on file  . Transportation needs:    Medical: Not on file    Non-medical: Not  on file  Tobacco Use  . Smoking status: Never Smoker  . Smokeless tobacco: Never Used  Substance and Sexual Activity  . Alcohol use: No    Frequency: Never  . Drug use: No  . Sexual activity: Not on file  Lifestyle  . Physical activity:    Days per week: Not on file    Minutes per session: Not on file  . Stress: Not on file  Relationships  . Social connections:    Talks on phone: Not on file    Gets together: Not on file    Attends religious service: Not on file    Active member of club or organization: Not on file    Attends meetings of clubs or organizations: Not on file    Relationship status: Not on file  . Intimate partner violence:    Fear of current or ex partner: Not on file    Emotionally abused: Not on file     Physically abused: Not on file    Forced sexual activity: Not on file  Other Topics Concern  . Not on file  Social History Narrative  . Not on file      Review of Systems  Constitutional: Negative for chills, fatigue and unexpected weight change.  HENT: Negative for congestion, postnasal drip, rhinorrhea, sneezing and sore throat.   Respiratory: Negative for cough, chest tightness, shortness of breath and wheezing.   Cardiovascular: Negative for chest pain and palpitations.  Gastrointestinal: Negative for abdominal pain, constipation, diarrhea, nausea and vomiting.  Endocrine: Negative for cold intolerance, heat intolerance, polydipsia and polyuria.       Blood sugar is doing well, overall.   Musculoskeletal: Negative for arthralgias, back pain, joint swelling and neck pain.  Skin: Negative for rash.  Allergic/Immunologic: Negative for environmental allergies.  Neurological: Positive for seizures. Negative for tremors and numbness.  Hematological: Negative for adenopathy. Does not bruise/bleed easily.  Psychiatric/Behavioral: Negative for behavioral problems (Depression), sleep disturbance and suicidal ideas. The patient is nervous/anxious.    Today's Vitals   11/16/18 0951  BP: 122/70  Pulse: 81  Resp: 16  SpO2: 96%  Weight: 211 lb 12.8 oz (96.1 kg)  Height: 5\' 4"  (1.626 m)   Body mass index is 36.36 kg/m.   Physical Exam Vitals signs and nursing note reviewed.  Constitutional:      General: He is not in acute distress.    Appearance: Normal appearance. He is well-developed. He is not diaphoretic.  HENT:     Head: Normocephalic and atraumatic.     Mouth/Throat:     Pharynx: No oropharyngeal exudate.  Eyes:     Pupils: Pupils are equal, round, and reactive to light.  Neck:     Musculoskeletal: Normal range of motion and neck supple.     Thyroid: No thyromegaly.     Vascular: No JVD.     Trachea: No tracheal deviation.  Cardiovascular:     Rate and Rhythm: Normal  rate and regular rhythm.     Heart sounds: Normal heart sounds. No murmur. No friction rub. No gallop.   Pulmonary:     Effort: Pulmonary effort is normal. No respiratory distress.     Breath sounds: Normal breath sounds. No wheezing or rales.  Chest:     Chest wall: No tenderness.  Abdominal:     General: Bowel sounds are normal.     Palpations: Abdomen is soft.  Musculoskeletal: Normal range of motion.  Lymphadenopathy:     Cervical: No  cervical adenopathy.  Skin:    General: Skin is warm and dry.  Neurological:     Mental Status: He is alert and oriented to person, place, and time.     Cranial Nerves: No cranial nerve deficit.     Comments: The patient is at his neurological baseline.   Psychiatric:        Mood and Affect: Mood is anxious.        Behavior: Behavior normal.        Thought Content: Thought content normal.        Judgment: Judgment normal.    Assessment/Plan: 1. Uncontrolled type 2 diabetes mellitus with hyperglycemia (HCC) - POCT HgB A1C 6.8 up from 6.1 at last visit. Discussed dietary and lifestyle changes to help lower blood sugar without changing medication.   2. Essential hypertension Stable. Continue bp medication as prescribed   3. History of seizure disorder Stable and seizure free. Continue phenobarbital as prescribed   General Counseling: Brandon Knight verbalizes understanding of the findings of todays visit and agrees with plan of treatment. I have discussed any further diagnostic evaluation that may be needed or ordered today. We also reviewed his medications today. he has been encouraged to call the office with any questions or concerns that should arise related to todays visit.  Diabetes Counseling:  1. Addition of ACE inh/ ARB'S for nephroprotection. Microalbumin is updated  2. Diabetic foot care, prevention of complications. Podiatry consult 3. Exercise and lose weight.  4. Diabetic eye examination, Diabetic eye exam is updated  5. Monitor blood  sugar closlely. nutrition counseling.  6. Sign and symptoms of hypoglycemia including shaking sweating,confusion and headaches.  This patient was seen by Vincent Gros FNP Collaboration with Dr Lyndon Code as a part of collaborative care agreement  Orders Placed This Encounter  Procedures  . POCT HgB A1C      Time spent: 25 Minutes      Dr Lyndon Code Internal medicine

## 2018-12-14 ENCOUNTER — Telehealth: Payer: Self-pay

## 2018-12-14 DIAGNOSIS — Z8669 Personal history of other diseases of the nervous system and sense organs: Secondary | ICD-10-CM

## 2018-12-14 MED ORDER — PHENOBARBITAL 32.4 MG PO TABS: ORAL_TABLET | ORAL | 3 refills | Status: DC

## 2018-12-14 NOTE — Telephone Encounter (Signed)
Send reminder for med refill for phenobarbital

## 2018-12-14 NOTE — Addendum Note (Signed)
Addended by: Lyndon Code on: 12/14/2018 12:08 PM   Modules accepted: Orders

## 2019-03-18 ENCOUNTER — Ambulatory Visit (INDEPENDENT_AMBULATORY_CARE_PROVIDER_SITE_OTHER): Payer: Medicare Other | Admitting: Nurse Practitioner

## 2019-03-18 ENCOUNTER — Encounter: Payer: Self-pay | Admitting: Nurse Practitioner

## 2019-03-18 ENCOUNTER — Other Ambulatory Visit: Payer: Self-pay

## 2019-03-18 VITALS — BP 114/72 | HR 75 | Resp 16 | Ht 62.0 in | Wt 207.8 lb

## 2019-03-18 DIAGNOSIS — Z8669 Personal history of other diseases of the nervous system and sense organs: Secondary | ICD-10-CM

## 2019-03-18 DIAGNOSIS — E785 Hyperlipidemia, unspecified: Secondary | ICD-10-CM | POA: Diagnosis not present

## 2019-03-18 DIAGNOSIS — E1165 Type 2 diabetes mellitus with hyperglycemia: Secondary | ICD-10-CM | POA: Diagnosis not present

## 2019-03-18 DIAGNOSIS — I10 Essential (primary) hypertension: Secondary | ICD-10-CM | POA: Diagnosis not present

## 2019-03-18 LAB — POCT GLYCOSYLATED HEMOGLOBIN (HGB A1C): Hemoglobin A1C: 8.3 % — AB (ref 4.0–5.6)

## 2019-03-18 MED ORDER — METFORMIN HCL 500 MG PO TABS: 500.0000 mg | ORAL_TABLET | Freq: Two times a day (BID) | ORAL | 3 refills | Status: DC

## 2019-03-18 NOTE — Progress Notes (Signed)
[facility] [place], [place] [id number]  Internal MEDICINE  Office Visit Note  Patient Name: Brandon Knight  [id number]  [id number]  Date of Service: 03/18/2019  Chief Complaint  Patient presents with  . Medical Management of Chronic Issues    4 month follow up  . Diabetes    A1C    The patient is here for follow up visit. Caregiver states that his blood sugar has been rising steadily. Today, HgbA1c is 8.3, up from 6.8 at the last check. He has not been on any medication for diabetic control in some time. Has been doing well controlling through diet. His blood pressure is well managed. He has no other concerns or ocmplaints today.       Current Medication: Outpatient Encounter Medications as of 03/18/2019  Medication Sig  . Calcium Carb-Cholecalciferol (CALCIUM 500 + D3) 500-600 MG-UNIT TABS Take 1 tablet by mouth.  . chlorhexidine (PERIDEX) 0.12 % solution Use as directed 15 mLs in the mouth or throat 2 (two) times daily.  . cyanocobalamin (,VITAMIN B-12,) 1000 MCG/ML injection Inject 1,000 mcg into the muscle every 30 (thirty) days.  . folic acid (FOLVITE) 1 MG tablet Take 1 mg by mouth daily.  Marland Kitchen gemfibrozil (LOPID) 600 MG tablet Take 600 mg by mouth 2 (two) times daily before a meal.  . glucose blood (ACCU-CHEK AVIVA PLUS) test strip USE TO CHECK BLOOD SUGAR DAILY AS DIRECTED  . loratadine (CLARITIN) 10 MG tablet Take 10 mg by mouth daily.  Marland Kitchen LORazepam (ATIVAN) 0.5 MG tablet Take 0.5 mg by mouth 2 (two) times daily. Take 1 tab po BID prn  . metoprolol tartrate (LOPRESSOR) 25 MG tablet Take 25 mg by mouth 2 (two) times daily.  . naproxen (EC NAPROSYN) 500 MG EC tablet Take 500 mg by mouth 2 (two) times daily with a meal.  . omeprazole (PRILOSEC) 40 MG capsule Take 40 mg by mouth daily.  Marland Kitchen PHENobarbital (LUMINAL) 32.4 MG tablet Take 4 tab QHS  . phenytoin (DILANTIN) 100 MG ER capsule Take 4 capsules (400 mg total) by mouth at bedtime.  . polyethylene  glycol (MIRALAX / GLYCOLAX) packet Take 17 g by mouth daily.  . sertraline (ZOLOFT) 25 MG tablet Take 25 mg by mouth daily.  Marland Kitchen thioridazine (MELLARIL) 25 MG tablet Take 25 mg by mouth daily. Take 1 tab po am given by psych  . thioridazine (MELLARIL) 50 MG tablet Take 50 mg by mouth at bedtime. Take 1 tab qhs by psych  . metFORMIN (GLUCOPHAGE) 500 MG tablet Take 1 tablet (500 mg total) by mouth 2 (two) times daily with a meal.   No facility-administered encounter medications on file as of 03/18/2019.     Surgical History: History reviewed. No pertinent surgical history.  Medical History: Past Medical History:  Diagnosis Date  . Diabetes mellitus without complication ([place])   . Hyperlipidemia   . Seizures ([place])     Family History: Family History  Family history unknown: Yes    Social History   Socioeconomic History  . Marital status: Single    Spouse name: Not on file  . Number of children: Not on file  . Years of education: Not on file  . Highest education level: Not on file  Occupational History  . Not on file  Social Needs  . Financial resource strain: Not on file  . Food insecurity    Worry: Not on file    Inability: Not on file  .  Transportation needs    Medical: Not on file    Non-medical: Not on file  Tobacco Use  . Smoking status: Never Smoker  . Smokeless tobacco: Never Used  Substance and Sexual Activity  . Alcohol use: No    Frequency: Never  . Drug use: No  . Sexual activity: Not on file  Lifestyle  . Physical activity    Days per week: Not on file    Minutes per session: Not on file  . Stress: Not on file  Relationships  . Social Musicianconnections    Talks on phone: Not on file    Gets together: Not on file    Attends religious service: Not on file    Active member of club or organization: Not on file    Attends meetings of clubs or organizations: Not on file    Relationship status: Not on file  . Intimate partner violence    Fear of current or ex  partner: Not on file    Emotionally abused: Not on file    Physically abused: Not on file    Forced sexual activity: Not on file  Other Topics Concern  . Not on file  Social History Narrative  . Not on file      Review of Systems  Constitutional: Negative for chills, fatigue and unexpected weight change.  HENT: Negative for congestion, postnasal drip, rhinorrhea, sneezing and sore throat.   Respiratory: Negative for cough, chest tightness, shortness of breath and wheezing.   Cardiovascular: Negative for chest pain and palpitations.  Gastrointestinal: Negative for abdominal pain, constipation, diarrhea, nausea and vomiting.  Endocrine: Negative for cold intolerance, heat intolerance, polydipsia and polyuria.       Blood sugars rising steadily over past several months.   Musculoskeletal: Negative for arthralgias, back pain, joint swelling and neck pain.  Skin: Negative for rash.  Allergic/Immunologic: Negative for environmental allergies.  Neurological: Positive for seizures. Negative for tremors and numbness.  Hematological: Negative for adenopathy. Does not bruise/bleed easily.  Psychiatric/Behavioral: Negative for behavioral problems (Depression), sleep disturbance and suicidal ideas. The patient is nervous/anxious.     Today's Vitals   03/18/19 1034  BP: 114/72  Pulse: 75  Resp: 16  SpO2: 95%  Weight: 207 lb 12.8 oz (94.3 kg)  Height: 5\' 2"  (1.575 m)   Body mass index is 38.01 kg/m.  Physical Exam Vitals signs and nursing note reviewed.  Constitutional:      General: He is not in acute distress.    Appearance: Normal appearance. He is well-developed. He is not diaphoretic.  HENT:     Head: Normocephalic and atraumatic.     Mouth/Throat:     Pharynx: No oropharyngeal exudate.  Eyes:     Pupils: Pupils are equal, round, and reactive to light.  Neck:     Musculoskeletal: Normal range of motion and neck supple.     Thyroid: No thyromegaly.     Vascular: No JVD.      Trachea: No tracheal deviation.  Cardiovascular:     Rate and Rhythm: Normal rate and regular rhythm.     Heart sounds: Normal heart sounds. No murmur. No friction rub. No gallop.   Pulmonary:     Effort: Pulmonary effort is normal. No respiratory distress.     Breath sounds: Normal breath sounds. No wheezing or rales.  Chest:     Chest wall: No tenderness.  Abdominal:     General: Bowel sounds are normal.     Palpations: Abdomen  is soft.  Musculoskeletal: Normal range of motion.  Lymphadenopathy:     Cervical: No cervical adenopathy.  Skin:    General: Skin is warm and dry.  Neurological:     Mental Status: He is alert and oriented to person, place, and time.     Cranial Nerves: No cranial nerve deficit.     Comments: The patient is at his neurological baseline.   Psychiatric:        Mood and Affect: Mood is anxious.        Behavior: Behavior normal.        Thought Content: Thought content normal.        Judgment: Judgment normal.   Assessment/Plan: 1. Uncontrolled type 2 diabetes mellitus with hyperglycemia (HCC) - POCT HgB A1C  8.3 today. Start metformin 500mg  twice daily. Continue to monitor AM, fasting blood sugars. Goal is to have these consistently under 150.  - metFORMIN (GLUCOPHAGE) 500 MG tablet; Take 1 tablet (500 mg total) by mouth 2 (two) times daily with a meal.  Dispense: 60 tablet; Refill: 3  2. Essential hypertension Stable. Continue bp medication as prescribed   3. Hyperlipidemia, unspecified hyperlipidemia type Stable.   4. History of seizure disorder Seizure free for some time. Continue phenobarbital as prescribed   General Counseling: Brandon Knight verbalizes understanding of the findings of todays visit and agrees with plan of treatment. I have discussed any further diagnostic evaluation that may be needed or ordered today. We also reviewed his medications today. he has been encouraged to call the office with any questions or concerns that should arise related  to todays visit.  Diabetes Counseling:  1. Addition of ACE inh/ ARB'S for nephroprotection. Microalbumin is updated  2. Diabetic foot care, prevention of complications. Podiatry consult 3. Exercise and lose weight.  4. Diabetic eye examination, Diabetic eye exam is updated  5. Monitor blood sugar closlely. nutrition counseling.  6. Sign and symptoms of hypoglycemia including shaking sweating,confusion and headaches.  This patient was seen by Vincent GrosHeather Alejandria Wessells FNP Collaboration with Dr Lyndon CodeFozia M Khan as a part of collaborative care agreement  Orders Placed This Encounter  Procedures  . POCT HgB A1C    Meds ordered this encounter  Medications  . metFORMIN (GLUCOPHAGE) 500 MG tablet    Sig: Take 1 tablet (500 mg total) by mouth 2 (two) times daily with a meal.    Dispense:  60 tablet    Refill:  3    Order Specific Question:   Supervising Provider    Answer:   Lyndon CodeKHAN, FOZIA M [[id number]]    Time spent: 7625 Minutes      Dr Lyndon CodeFozia M Khan Internal medicine

## 2019-03-29 ENCOUNTER — Other Ambulatory Visit: Payer: Self-pay | Admitting: Nurse Practitioner

## 2019-03-29 DIAGNOSIS — Z8669 Personal history of other diseases of the nervous system and sense organs: Secondary | ICD-10-CM

## 2019-03-29 MED ORDER — PHENOBARBITAL 32.4 MG PO TABS: ORAL_TABLET | ORAL | 3 refills | Status: DC

## 2019-03-29 NOTE — Progress Notes (Signed)
Renewed phenobarbital prescription per pharmacy request.

## 2019-03-30 ENCOUNTER — Telehealth: Payer: Self-pay

## 2019-03-30 NOTE — Telephone Encounter (Signed)
PLAN OF CARE SIGNED AND PLACED IN KINDRED FOLDER °

## 2019-04-27 ENCOUNTER — Telehealth: Payer: Self-pay

## 2019-04-27 NOTE — Telephone Encounter (Signed)
PLAN OF CARE SIGNED AND PLACED IN KINDRED FOLDER °

## 2019-05-25 ENCOUNTER — Ambulatory Visit: Payer: Medicare Other | Admitting: Nurse Practitioner

## 2019-05-26 ENCOUNTER — Telehealth: Payer: Self-pay

## 2019-05-26 NOTE — Telephone Encounter (Signed)
ORDERS SIGNED AND PLACED IN KINDRED FOLDER. °

## 2019-06-01 ENCOUNTER — Ambulatory Visit: Payer: Medicare Other | Admitting: Nurse Practitioner

## 2019-08-06 ENCOUNTER — Telehealth: Payer: Self-pay

## 2019-08-06 NOTE — Telephone Encounter (Signed)
CONFIRMED 08-10-19 OV AS VIRTUAL. °

## 2019-08-10 ENCOUNTER — Ambulatory Visit (INDEPENDENT_AMBULATORY_CARE_PROVIDER_SITE_OTHER): Payer: Medicare Other | Admitting: Nurse Practitioner

## 2019-08-10 ENCOUNTER — Encounter: Payer: Self-pay | Admitting: Nurse Practitioner

## 2019-08-10 ENCOUNTER — Other Ambulatory Visit: Payer: Self-pay

## 2019-08-10 VITALS — BP 160/80 | HR 73 | Ht 62.0 in | Wt 202.0 lb

## 2019-08-10 DIAGNOSIS — I10 Essential (primary) hypertension: Secondary | ICD-10-CM | POA: Diagnosis not present

## 2019-08-10 DIAGNOSIS — E119 Type 2 diabetes mellitus without complications: Secondary | ICD-10-CM

## 2019-08-10 DIAGNOSIS — Z8669 Personal history of other diseases of the nervous system and sense organs: Secondary | ICD-10-CM | POA: Diagnosis not present

## 2019-08-10 MED ORDER — PHENOBARBITAL 32.4 MG PO TABS: ORAL_TABLET | ORAL | 3 refills | Status: DC

## 2019-08-10 NOTE — Progress Notes (Signed)
[facility] [address] [place], [place] [address]  Internal MEDICINE  Telephone Visit  Patient Name: Brandon Knight  [id number]  [id number]  Date of Service: 08/10/2019  I connected with the patient at 11:10am by telephone and verified the patients identity using two identifiers.   I discussed the limitations, risks, security and privacy concerns of performing an evaluation and management service by telephone and the availability of in person appointments. I also discussed with the patient that there may be a patient responsible charge related to the service.  The patient expressed understanding and agrees to proceed.    Chief Complaint  Patient presents with  . Telephone Assessment  . Telephone Screen  . Diabetes    212 glucose  . Hyperlipidemia  . Medication Refill    Phenobarbital     The patient has been contacted via telephone for follow up visit due to concerns for spread of novel coronavirus. The patient presents for follow up visit. Spoke to caregiver over the phone. She states the patient is doing well. Blood pressure is stable. Blood sugars are doing well. Sugar usually running in the low to mid 100s. She states that there is home health coming into the home so patient is receiving his monthly B12 injections. The patient's caregiver states there are no concerns or complaints. She states she will find out if he had flu shot and if he had eyes examined this year so we can update his health maintenance record.       Current Medication: Outpatient Encounter Medications as of 08/10/2019  Medication Sig  . Calcium Carb-Cholecalciferol (CALCIUM 500 + D3) 500-600 MG-UNIT TABS Take 1 tablet by mouth.  . chlorhexidine (PERIDEX) 0.12 % solution Use as directed 15 mLs in the mouth or throat 2 (two) times daily.  . cyanocobalamin (,VITAMIN B-12,) 1000 MCG/ML injection Inject 1,000 mcg into the muscle every 30 (thirty) days.  . folic acid (FOLVITE) 1 MG tablet Take 1 mg  by mouth daily.  Marland Kitchen gemfibrozil (LOPID) 600 MG tablet Take 600 mg by mouth 2 (two) times daily before a meal.  . glucose blood (ACCU-CHEK AVIVA PLUS) test strip USE TO CHECK BLOOD SUGAR DAILY AS DIRECTED  . loratadine (CLARITIN) 10 MG tablet Take 10 mg by mouth daily.  Marland Kitchen LORazepam (ATIVAN) 0.5 MG tablet Take 0.5 mg by mouth 2 (two) times daily. Take 1 tab po BID prn  . metFORMIN (GLUCOPHAGE) 500 MG tablet Take 1 tablet (500 mg total) by mouth 2 (two) times daily with a meal.  . metoprolol tartrate (LOPRESSOR) 25 MG tablet Take 25 mg by mouth 2 (two) times daily.  . naproxen (EC NAPROSYN) 500 MG EC tablet Take 500 mg by mouth 2 (two) times daily with a meal.  . omeprazole (PRILOSEC) 40 MG capsule Take 40 mg by mouth daily.  Marland Kitchen PHENobarbital (LUMINAL) 32.4 MG tablet Take 4 tab QHS  . phenytoin (DILANTIN) 100 MG ER capsule Take 4 capsules (400 mg total) by mouth at bedtime.  . polyethylene glycol (MIRALAX / GLYCOLAX) packet Take 17 g by mouth daily.  . sertraline (ZOLOFT) 25 MG tablet Take 25 mg by mouth daily.  Marland Kitchen thioridazine (MELLARIL) 25 MG tablet Take 25 mg by mouth daily. Take 1 tab po am given by psych  . thioridazine (MELLARIL) 50 MG tablet Take 50 mg by mouth at bedtime. Take 1 tab qhs by psych  . [DISCONTINUED] PHENobarbital (LUMINAL) 32.4 MG tablet Take 4 tab QHS   No facility-administered  encounter medications on file as of 08/10/2019.     Surgical History: History reviewed. No pertinent surgical history.  Medical History: Past Medical History:  Diagnosis Date  . Diabetes mellitus without complication ([place])   . Hyperlipidemia   . Seizures ([place])     Family History: Family History  Family history unknown: Yes    Social History   Socioeconomic History  . Marital status: Single    Spouse name: Not on file  . Number of children: Not on file  . Years of education: Not on file  . Highest education level: Not on file  Occupational History  . Not on file  Social Needs  .  Financial resource strain: Not on file  . Food insecurity    Worry: Not on file    Inability: Not on file  . Transportation needs    Medical: Not on file    Non-medical: Not on file  Tobacco Use  . Smoking status: Never Smoker  . Smokeless tobacco: Never Used  Substance and Sexual Activity  . Alcohol use: No    Frequency: Never  . Drug use: No  . Sexual activity: Not on file  Lifestyle  . Physical activity    Days per week: Not on file    Minutes per session: Not on file  . Stress: Not on file  Relationships  . Social Herbalist on phone: Not on file    Gets together: Not on file    Attends religious service: Not on file    Active member of club or organization: Not on file    Attends meetings of clubs or organizations: Not on file    Relationship status: Not on file  . Intimate partner violence    Fear of current or ex partner: Not on file    Emotionally abused: Not on file    Physically abused: Not on file    Forced sexual activity: Not on file  Other Topics Concern  . Not on file  Social History Narrative  . Not on file      Review of Systems  Constitutional: Negative for chills, fatigue and unexpected weight change.  HENT: Negative for congestion, postnasal drip, rhinorrhea, sneezing and sore throat.   Respiratory: Negative for cough, chest tightness, shortness of breath and wheezing.   Cardiovascular: Negative for chest pain and palpitations.  Gastrointestinal: Negative for abdominal pain, constipation, diarrhea, nausea and vomiting.  Endocrine: Negative for cold intolerance, heat intolerance, polydipsia and polyuria.       Blood sugars running in low to mid 100s.   Musculoskeletal: Negative for arthralgias, back pain, joint swelling and neck pain.  Skin: Negative for rash.  Allergic/Immunologic: Negative for environmental allergies.  Neurological: Positive for seizures. Negative for tremors and numbness.  Hematological: Negative for adenopathy.  Does not bruise/bleed easily.  Psychiatric/Behavioral: Negative for behavioral problems (Depression), sleep disturbance and suicidal ideas. The patient is nervous/anxious.     Today's Vitals   08/10/19 1051  BP: (!) 160/80  Pulse: 73  Weight: 202 lb (91.6 kg)  Height: 5\' 2"  (1.575 m)   Body mass index is 36.95 kg/m.  Observation/Objective:  The patient is alert and oriented. He is pleasant and answering all questions appropriately. Breathing is non-labored. He is in no acute distress.    Assessment/Plan: 1. Essential hypertension Blood pressure is generally stable. Continue bp medication as prescribed   2. Type 2 diabetes mellitus without complication, without long-term current use of insulin (HCC) Continue  all diabetic medication as prescribed   3. History of seizure disorder Continue phenobarbital and dilantin as previously prescribed. Refilled phenobarbital today.  - PHENobarbital (LUMINAL) 32.4 MG tablet; Take 4 tab QHS  Dispense: 120 tablet; Refill: 3  General Counseling: Mieczyslaw verbalizes understanding of the findings of today's phone visit and agrees with plan of treatment. I have discussed any further diagnostic evaluation that may be needed or ordered today. We also reviewed his medications today. he has been encouraged to call the office with any questions or concerns that should arise related to todays visit.  This patient was seen by Vincent GrosHeather Ayesha Markwell FNP Collaboration with Dr Lyndon CodeFozia M Khan as a part of collaborative care agreement  Meds ordered this encounter  Medications  . PHENobarbital (LUMINAL) 32.4 MG tablet    Sig: Take 4 tab QHS    Dispense:  120 tablet    Refill:  3    Order Specific Question:   Supervising Provider    Answer:   Lyndon CodeKHAN, FOZIA M [[id number]]    Time spent: 2025 Minutes    Dr Lyndon CodeFozia M Khan Internal medicine

## 2019-10-22 ENCOUNTER — Other Ambulatory Visit: Payer: Self-pay

## 2019-10-22 DIAGNOSIS — E11649 Type 2 diabetes mellitus with hypoglycemia without coma: Secondary | ICD-10-CM

## 2019-10-22 MED ORDER — ACCU-CHEK AVIVA PLUS VI STRP: ORAL_STRIP | 3 refills | Status: DC

## 2019-11-02 ENCOUNTER — Other Ambulatory Visit: Payer: Self-pay

## 2019-11-02 DIAGNOSIS — E11649 Type 2 diabetes mellitus with hypoglycemia without coma: Secondary | ICD-10-CM

## 2019-11-02 MED ORDER — ACCU-CHEK AVIVA PLUS VI STRP: ORAL_STRIP | 3 refills | Status: DC

## 2019-11-05 ENCOUNTER — Telehealth: Payer: Self-pay

## 2019-11-05 NOTE — Telephone Encounter (Signed)
VM WAS FULL UNABLE TO LMOM FOR REMINDER FOR 11-09-19 OV.

## 2019-11-09 ENCOUNTER — Ambulatory Visit: Payer: Medicare Other | Admitting: Nurse Practitioner

## 2019-11-11 ENCOUNTER — Telehealth: Payer: Self-pay

## 2019-11-11 NOTE — Telephone Encounter (Signed)
SCREENED AND CONFIRMED APPT FOR 11/15/19

## 2019-11-15 ENCOUNTER — Encounter: Payer: Self-pay | Admitting: Nurse Practitioner

## 2019-11-15 ENCOUNTER — Other Ambulatory Visit: Payer: Self-pay

## 2019-11-15 ENCOUNTER — Ambulatory Visit (INDEPENDENT_AMBULATORY_CARE_PROVIDER_SITE_OTHER): Payer: Medicare Other | Admitting: Nurse Practitioner

## 2019-11-15 VITALS — BP 117/65 | HR 84 | Temp 97.3°F | Resp 16 | Ht 62.0 in | Wt 214.4 lb

## 2019-11-15 DIAGNOSIS — Z8669 Personal history of other diseases of the nervous system and sense organs: Secondary | ICD-10-CM

## 2019-11-15 DIAGNOSIS — F411 Generalized anxiety disorder: Secondary | ICD-10-CM

## 2019-11-15 DIAGNOSIS — E1165 Type 2 diabetes mellitus with hyperglycemia: Secondary | ICD-10-CM

## 2019-11-15 DIAGNOSIS — I10 Essential (primary) hypertension: Secondary | ICD-10-CM | POA: Diagnosis not present

## 2019-11-15 DIAGNOSIS — E785 Hyperlipidemia, unspecified: Secondary | ICD-10-CM

## 2019-11-15 LAB — POCT GLYCOSYLATED HEMOGLOBIN (HGB A1C): Hemoglobin A1C: 7.8 % — AB (ref 4.0–5.6)

## 2019-11-15 MED ORDER — METFORMIN HCL 1000 MG PO TABS: 1000.0000 mg | ORAL_TABLET | Freq: Two times a day (BID) | ORAL | 3 refills | Status: DC

## 2019-11-15 NOTE — Progress Notes (Signed)
[facility] [address] [place], [place] [address]  Internal MEDICINE  Office Visit Note  Patient Name: Brandon Knight  [id number]  [id number]  Date of Service: 11/21/2019  Chief Complaint  Patient presents with  . Diabetes  . Hypertension  . Hyperlipidemia  . Seizures    The patient is here for routine follow up. Patient presents to the office with caregiver. States that blood sugars continue to run in the high 100s and sometimes in the low 200s. He was started on metformin 500mg  twice daily. His HgbA1c has improved from 8.3 to 7.8 today. He is tolerating this medication well. He has no other concerns or complaints today. Patient's caregiver has brought Fl2 forms as well as care plan forms which need to be updated,  Reviewed, and signed.       Current Medication: Outpatient Encounter Medications as of 11/15/2019  Medication Sig  . Calcium Carb-Cholecalciferol (CALCIUM 500 + D3) 500-600 MG-UNIT TABS Take 1 tablet by mouth.  . chlorhexidine (PERIDEX) 0.12 % solution Use as directed 15 mLs in the mouth or throat 2 (two) times daily.  . cyanocobalamin (,VITAMIN B-12,) 1000 MCG/ML injection Inject 1,000 mcg into the muscle every 30 (thirty) days.  . folic acid (FOLVITE) 1 MG tablet Take 1 mg by mouth daily.  11/17/2019 gemfibrozil (LOPID) 600 MG tablet Take 600 mg by mouth 2 (two) times daily before a meal.  . glucose blood (ACCU-CHEK AVIVA PLUS) test strip USE TO CHECK BLOOD SUGAR DAILY AS DIRECTED. Dx e11.65  . loratadine (CLARITIN) 10 MG tablet Take 10 mg by mouth daily.  Marland Kitchen LORazepam (ATIVAN) 0.5 MG tablet Take 0.5 mg by mouth 2 (two) times daily. Take 1 tab po BID prn  . metFORMIN (GLUCOPHAGE) 1000 MG tablet Take 1 tablet (1,000 mg total) by mouth 2 (two) times daily with a meal.  . metoprolol tartrate (LOPRESSOR) 25 MG tablet Take 25 mg by mouth 2 (two) times daily.  . naproxen (EC NAPROSYN) 500 MG EC tablet Take 500 mg by mouth 2 (two) times daily with a meal.  .  omeprazole (PRILOSEC) 40 MG capsule Take 40 mg by mouth daily.  Marland Kitchen PHENobarbital (LUMINAL) 32.4 MG tablet Take 4 tab QHS  . phenytoin (DILANTIN) 100 MG ER capsule Take 4 capsules (400 mg total) by mouth at bedtime.  . polyethylene glycol (MIRALAX / GLYCOLAX) packet Take 17 g by mouth daily.  . sertraline (ZOLOFT) 25 MG tablet Take 25 mg by mouth daily.  Marland Kitchen thioridazine (MELLARIL) 25 MG tablet Take 25 mg by mouth daily. Take 1 tab po am given by psych  . thioridazine (MELLARIL) 50 MG tablet Take 50 mg by mouth at bedtime. Take 1 tab qhs by psych  . [DISCONTINUED] metFORMIN (GLUCOPHAGE) 500 MG tablet Take 1 tablet (500 mg total) by mouth 2 (two) times daily with a meal.   No facility-administered encounter medications on file as of 11/15/2019.    Surgical History: History reviewed. No pertinent surgical history.  Medical History: Past Medical History:  Diagnosis Date  . Diabetes mellitus without complication (HCC)   . Hyperlipidemia   . Seizures (HCC)     Family History: Family History  Family history unknown: Yes    Social History   Socioeconomic History  . Marital status: Single    Spouse name: Not on file  . Number of children: Not on file  . Years of education: Not on file  . Highest education level: Not on file  Occupational History  .  Not on file  Tobacco Use  . Smoking status: Never Smoker  . Smokeless tobacco: Never Used  Substance and Sexual Activity  . Alcohol use: No  . Drug use: No  . Sexual activity: Not on file  Other Topics Concern  . Not on file  Social History Narrative  . Not on file   Social Determinants of Health   Financial Resource Strain:   . Difficulty of Paying Living Expenses:   Food Insecurity:   . Worried About Programme researcher, broadcasting/film/video in the Last Year:   . Barista in the Last Year:   Transportation Needs:   . Freight forwarder (Medical):   Marland Kitchen Lack of Transportation (Non-Medical):   Physical Activity:   . Days of Exercise per  Week:   . Minutes of Exercise per Session:   Stress:   . Feeling of Stress :   Social Connections:   . Frequency of Communication with Friends and Family:   . Frequency of Social Gatherings with Friends and Family:   . Attends Religious Services:   . Active Member of Clubs or Organizations:   . Attends Banker Meetings:   Marland Kitchen Marital Status:   Intimate Partner Violence:   . Fear of Current or Ex-Partner:   . Emotionally Abused:   Marland Kitchen Physically Abused:   . Sexually Abused:       Review of Systems  Constitutional: Negative for chills, fatigue and unexpected weight change.  HENT: Negative for congestion, postnasal drip, rhinorrhea, sneezing and sore throat.   Respiratory: Negative for cough, chest tightness, shortness of breath and wheezing.   Cardiovascular: Negative for chest pain and palpitations.  Gastrointestinal: Negative for abdominal pain, constipation, diarrhea, nausea and vomiting.  Endocrine: Negative for cold intolerance, heat intolerance, polydipsia and polyuria.       Blood sugars are running in high 100s to low 200s.    Musculoskeletal: Negative for arthralgias, back pain, joint swelling and neck pain.  Skin: Negative for rash.  Allergic/Immunologic: Negative for environmental allergies.  Neurological: Positive for seizures. Negative for tremors and numbness.  Hematological: Negative for adenopathy. Does not bruise/bleed easily.  Psychiatric/Behavioral: Negative for behavioral problems (Depression), sleep disturbance and suicidal ideas. The patient is nervous/anxious.     Today's Vitals   11/15/19 1056  BP: 117/65  Pulse: 84  Resp: 16  Temp: (!) 97.3 F (36.3 C)  SpO2: 92%  Weight: 214 lb 6.4 oz (97.3 kg)  Height: 5\' 2"  (1.575 m)   Body mass index is 39.21 kg/m.  Physical Exam Vitals and nursing note reviewed.  Constitutional:      General: He is not in acute distress.    Appearance: Normal appearance. He is well-developed. He is not  diaphoretic.  HENT:     Head: Normocephalic and atraumatic.     Mouth/Throat:     Pharynx: No oropharyngeal exudate.  Eyes:     Pupils: Pupils are equal, round, and reactive to light.  Neck:     Thyroid: No thyromegaly.     Vascular: No JVD.     Trachea: No tracheal deviation.  Cardiovascular:     Rate and Rhythm: Normal rate and regular rhythm.     Heart sounds: Normal heart sounds. No murmur. No friction rub. No gallop.   Pulmonary:     Effort: Pulmonary effort is normal. No respiratory distress.     Breath sounds: Normal breath sounds. No wheezing or rales.  Chest:     Chest  wall: No tenderness.  Abdominal:     General: Bowel sounds are normal.     Palpations: Abdomen is soft.     Tenderness: There is no abdominal tenderness.  Musculoskeletal:        General: Normal range of motion.     Cervical back: Normal range of motion and neck supple.  Lymphadenopathy:     Cervical: No cervical adenopathy.  Skin:    General: Skin is warm and dry.  Neurological:     Mental Status: He is alert and oriented to person, place, and time.     Cranial Nerves: No cranial nerve deficit.     Comments: The patient is at his neurological baseline.   Psychiatric:        Mood and Affect: Mood is anxious.        Behavior: Behavior normal.        Thought Content: Thought content normal.        Judgment: Judgment normal.    Assessment/Plan: 1. Type 2 diabetes mellitus with hyperglycemia, without long-term current use of insulin (HCC) - POCT HgB A1C 7.8 today, down from 8.3 at his last visit. Increase metformin to 1000mg  twice daily. Continue to monitor blood sugars twice daily. Check HgbA1c at next visit and adjust diabetic medication as prescribed  - metFORMIN (GLUCOPHAGE) 1000 MG tablet; Take 1 tablet (1,000 mg total) by mouth 2 (two) times daily with a meal.  Dispense: 60 tablet; Refill: 3  2. Essential hypertension Stable. Continue bp medication as prescribed   3. Hyperlipidemia,  unspecified hyperlipidemia type Continue gemfibrizil as prescribed   4. History of seizure disorder Continue phenobarbital as prescribed   5. GAD (generalized anxiety disorder) Regular visit with psychiatry as scheduled.    General Counseling: Aeson verbalizes understanding of the findings of todays visit and agrees with plan of treatment. I have discussed any further diagnostic evaluation that may be needed or ordered today. We also reviewed his medications today. he has been encouraged to call the office with any questions or concerns that should arise related to todays visit.  Diabetes Counseling:  1. Addition of ACE inh/ ARB'S for nephroprotection. Microalbumin is updated  2. Diabetic foot care, prevention of complications. Podiatry consult 3. Exercise and lose weight.  4. Diabetic eye examination, Diabetic eye exam is updated  5. Monitor blood sugar closlely. nutrition counseling.  6. Sign and symptoms of hypoglycemia including shaking sweating,confusion and headaches.  This patient was seen by Leretha Pol FNP Collaboration with Dr Lavera Guise as a part of collaborative care agreement  Orders Placed This Encounter  Procedures  . POCT HgB A1C    Meds ordered this encounter  Medications  . metFORMIN (GLUCOPHAGE) 1000 MG tablet    Sig: Take 1 tablet (1,000 mg total) by mouth 2 (two) times daily with a meal.    Dispense:  60 tablet    Refill:  3    Please note increased dosing    Order Specific Question:   Supervising Provider    Answer:   Lavera Guise [[id number]]    Total time spent: 30 Minutes   Time spent includes review of chart, medications, test results, and follow up plan with the patient.      Dr Lavera Guise Internal medicine

## 2019-12-01 ENCOUNTER — Other Ambulatory Visit: Payer: Self-pay | Admitting: Nurse Practitioner

## 2019-12-01 DIAGNOSIS — Z8669 Personal history of other diseases of the nervous system and sense organs: Secondary | ICD-10-CM

## 2019-12-01 MED ORDER — PHENOBARBITAL 32.4 MG PO TABS: ORAL_TABLET | ORAL | 3 refills | Status: DC

## 2019-12-01 NOTE — Progress Notes (Signed)
Renewed phenobarbital per pharmacy request

## 2020-02-09 ENCOUNTER — Other Ambulatory Visit: Payer: Self-pay

## 2020-02-09 DIAGNOSIS — E11649 Type 2 diabetes mellitus with hypoglycemia without coma: Secondary | ICD-10-CM

## 2020-02-09 MED ORDER — ACCU-CHEK AVIVA PLUS VI STRP: ORAL_STRIP | 3 refills | Status: DC

## 2020-02-10 ENCOUNTER — Telehealth: Payer: Self-pay

## 2020-02-10 NOTE — Telephone Encounter (Signed)
Lmom to confirm and screen for 02-14-20 ov. 

## 2020-02-14 ENCOUNTER — Ambulatory Visit: Payer: Medicare Other | Admitting: Nurse Practitioner

## 2020-02-14 ENCOUNTER — Telehealth: Payer: Self-pay

## 2020-02-14 NOTE — Telephone Encounter (Signed)
Patient rescheduled appointment on 02/14/2020 to 03/27/2020. klh

## 2020-03-23 ENCOUNTER — Telehealth: Payer: Self-pay

## 2020-03-23 ENCOUNTER — Other Ambulatory Visit: Payer: Self-pay

## 2020-03-23 DIAGNOSIS — Z8669 Personal history of other diseases of the nervous system and sense organs: Secondary | ICD-10-CM

## 2020-03-23 NOTE — Telephone Encounter (Signed)
Confirmed and screened for 03-27-20 ov. 

## 2020-03-24 MED ORDER — PHENOBARBITAL 32.4 MG PO TABS: ORAL_TABLET | ORAL | 3 refills | Status: DC

## 2020-03-27 ENCOUNTER — Other Ambulatory Visit: Payer: Self-pay

## 2020-03-27 ENCOUNTER — Encounter: Payer: Self-pay | Admitting: Nurse Practitioner

## 2020-03-27 ENCOUNTER — Ambulatory Visit (INDEPENDENT_AMBULATORY_CARE_PROVIDER_SITE_OTHER): Payer: Medicare Other | Admitting: Nurse Practitioner

## 2020-03-27 VITALS — BP 136/80 | HR 96 | Temp 97.3°F | Resp 16 | Ht 62.0 in | Wt 218.2 lb

## 2020-03-27 DIAGNOSIS — Z0001 Encounter for general adult medical examination with abnormal findings: Secondary | ICD-10-CM

## 2020-03-27 DIAGNOSIS — E1165 Type 2 diabetes mellitus with hyperglycemia: Secondary | ICD-10-CM

## 2020-03-27 DIAGNOSIS — I10 Essential (primary) hypertension: Secondary | ICD-10-CM

## 2020-03-27 DIAGNOSIS — R300 Dysuria: Secondary | ICD-10-CM | POA: Diagnosis not present

## 2020-03-27 DIAGNOSIS — Z8669 Personal history of other diseases of the nervous system and sense organs: Secondary | ICD-10-CM

## 2020-03-27 LAB — POCT GLYCOSYLATED HEMOGLOBIN (HGB A1C): Hemoglobin A1C: 6.9 % — AB (ref 4.0–5.6)

## 2020-03-27 NOTE — Progress Notes (Signed)
[facility] [address] [place], [place] [address]  Internal MEDICINE  Office Visit Note  Patient Name: Brandon Knight  [id number]  [id number]  Date of Service: 04/22/2020   Pt is here for routine health maintenance examination  Chief Complaint  Patient presents with  . Medicare Wellness  . Diabetes  . Hyperlipidemia  . Quality Metric Gaps    Colonoscopy, TDAP, Eye Exam, Pneumococcal     The patient is here for health maintenance exam. [facility] currently resides [facility]. He is a diabetic with improving glucose control. His HgbA1c is 6.9 today, down from 7.8 at the most recent check. There is opthamologist who evaluates the patients at home. Last visit was 10/2019 and patient was negative for diabetic retinopathy. He also has a podiatrist who comes to his residence who performs routine foot care for the patient.  His blood sugars are doing well. His HgbA1c is 6.9 today, down from 7.8 at his last visit.     Current Medication: Outpatient Encounter Medications as of 03/27/2020  Medication Sig  . Calcium Carb-Cholecalciferol (CALCIUM 500 + D3) 500-600 MG-UNIT TABS Take 1 tablet by mouth.  . chlorhexidine (PERIDEX) 0.12 % solution Use as directed 15 mLs in the mouth or throat 2 (two) times daily.  . cyanocobalamin (,VITAMIN B-12,) 1000 MCG/ML injection Inject 1,000 mcg into the muscle every 30 (thirty) days.  . folic acid (FOLVITE) 1 MG tablet Take 1 mg by mouth daily.  Marland Kitchen gemfibrozil (LOPID) 600 MG tablet Take 600 mg by mouth 2 (two) times daily before a meal.  . glucose blood (ACCU-CHEK AVIVA PLUS) test strip USE TO CHECK BLOOD SUGAR DAILY AS DIRECTED. Dx e11.65  . loratadine (CLARITIN) 10 MG tablet Take 10 mg by mouth daily.  Marland Kitchen LORazepam (ATIVAN) 0.5 MG tablet Take 0.5 mg by mouth 2 (two) times daily. Take 1 tab po BID prn  . metFORMIN (GLUCOPHAGE) 1000 MG tablet Take 1 tablet (1,000 mg total) by mouth 2 (two) times daily with a meal.  .  metoprolol tartrate (LOPRESSOR) 25 MG tablet Take 25 mg by mouth 2 (two) times daily.  . naproxen (EC NAPROSYN) 500 MG EC tablet Take 500 mg by mouth 2 (two) times daily with a meal.  . omeprazole (PRILOSEC) 40 MG capsule Take 40 mg by mouth daily.  Marland Kitchen PHENobarbital (LUMINAL) 32.4 MG tablet Take 4 tab QHS  . phenytoin (DILANTIN) 100 MG ER capsule Take 4 capsules (400 mg total) by mouth at bedtime.  . polyethylene glycol (MIRALAX / GLYCOLAX) packet Take 17 g by mouth daily.  . sertraline (ZOLOFT) 25 MG tablet Take 25 mg by mouth daily.  Marland Kitchen thioridazine (MELLARIL) 25 MG tablet Take 25 mg by mouth daily. Take 1 tab po am given by psych  . thioridazine (MELLARIL) 50 MG tablet Take 50 mg by mouth at bedtime. Take 1 tab qhs by psych   No facility-administered encounter medications on file as of 03/27/2020.    Surgical History: History reviewed. No pertinent surgical history.  Medical History: Past Medical History:  Diagnosis Date  . Diabetes mellitus without complication (HCC)   . Hyperlipidemia   . Seizures (HCC)     Family History: Family History  Family history unknown: Yes      Review of Systems  Constitutional: Negative for activity change, chills, fatigue and unexpected weight change.  HENT: Negative for congestion, postnasal drip, rhinorrhea, sneezing and sore throat.   Respiratory: Negative for cough, chest tightness, shortness  of breath and wheezing.   Cardiovascular: Negative for chest pain and palpitations.  Gastrointestinal: Negative for abdominal pain, constipation, diarrhea, nausea and vomiting.  Endocrine: Negative for cold intolerance, heat intolerance, polydipsia and polyuria.       Improved blood sugars since his last visit.   Genitourinary: Negative for dysuria, frequency and urgency.  Musculoskeletal: Negative for arthralgias, back pain, joint swelling and neck pain.  Skin: Negative for rash.  Allergic/Immunologic: Negative for environmental allergies.   Neurological: Positive for seizures. Negative for tremors and numbness.  Hematological: Negative for adenopathy. Does not bruise/bleed easily.  Psychiatric/Behavioral: Negative for behavioral problems (Depression), sleep disturbance and suicidal ideas. The patient is nervous/anxious.      Today's Vitals   03/27/20 1357  BP: (!) 136/80  Pulse: 96  Resp: 16  Temp: (!) 97.3 F (36.3 C)  SpO2: 97%  Weight: (!) 218 lb 3.2 oz (99 kg)  Height: 5\' 2"  (1.575 m)   Body mass index is 39.91 kg/m.  Physical Exam Vitals and nursing note reviewed.  Constitutional:      General: He is not in acute distress.    Appearance: Normal appearance. He is well-developed. He is not diaphoretic.  HENT:     Head: Normocephalic and atraumatic.     Nose: Nose normal.     Mouth/Throat:     Pharynx: No oropharyngeal exudate.  Eyes:     Pupils: Pupils are equal, round, and reactive to light.  Neck:     Thyroid: No thyromegaly.     Vascular: No carotid bruit or JVD.     Trachea: No tracheal deviation.  Cardiovascular:     Rate and Rhythm: Normal rate and regular rhythm.     Pulses: Normal pulses.     Heart sounds: Normal heart sounds. No murmur heard.  No friction rub. No gallop.   Pulmonary:     Effort: Pulmonary effort is normal. No respiratory distress.     Breath sounds: Normal breath sounds. No wheezing or rales.  Chest:     Chest wall: No tenderness.  Abdominal:     General: Bowel sounds are normal.     Palpations: Abdomen is soft.     Tenderness: There is no abdominal tenderness.  Musculoskeletal:        General: Normal range of motion.     Cervical back: Normal range of motion and neck supple.  Lymphadenopathy:     Cervical: No cervical adenopathy.  Skin:    General: Skin is warm and dry.  Neurological:     Mental Status: He is alert and oriented to person, place, and time.     Cranial Nerves: No cranial nerve deficit.     Comments: The patient is at his neurological baseline.    Psychiatric:        Mood and Affect: Mood is anxious.        Behavior: Behavior normal.        Thought Content: Thought content normal.        Judgment: Judgment normal.    Depression screen [facility] 2/9 03/27/2020 11/15/2019 08/10/2019 03/18/2019 11/16/2018  Decreased Interest 0 0 0 0 0  Down, Depressed, Hopeless 0 0 0 0 0  PHQ - 2 Score 0 0 0 0 0    Functional Status Survey: Is the patient deaf or have difficulty hearing?: No Does the patient have difficulty seeing, even when wearing glasses/contacts?: No Does the patient have difficulty concentrating, remembering, or making decisions?: Yes Does the patient have  difficulty walking or climbing stairs?: No Does the patient have difficulty dressing or bathing?: No Does the patient have difficulty doing errands alone such as visiting a doctor's office or shopping?: Yes  MMSE - Mini Mental State Exam 03/27/2020 04/15/2018  Not completed: Unable to complete Unable to complete    Fall Risk  03/27/2020 11/15/2019 08/10/2019 03/18/2019 11/16/2018  Falls in the past year? 0 0 0 0 0      LABS: Recent Results (from the past 2160 hour(s))  POCT HgB A1C     Status: Abnormal   Collection Time: 03/27/20  2:12 PM  Result Value Ref Range   Hemoglobin A1C 6.9 (A) 4.0 - 5.6 %   HbA1c POC (<> result, manual entry)     HbA1c, POC (prediabetic range)     HbA1c, POC (controlled diabetic range)      Assessment/Plan: 1. Encounter for general adult medical examination with abnormal findings Annual health maintenance exam today. Orders given to have routine, fasting labs drawn.   2. Type 2 diabetes mellitus with hyperglycemia, without long-term current use of insulin (HCC) - POCT HgB A1C 6.9 today, down from 7.8 at most recent check. Continue diabetic medication as prescribed   3. Essential hypertension Stable. Continue bp medication as prescribed.   4. History of seizure disorder Check phytoin level and adjust dosing as prescribed.     General  Counseling: Timithy verbalizes understanding of the findings of todays visit and agrees with plan of treatment. I have discussed any further diagnostic evaluation that may be needed or ordered today. We also reviewed his medications today. he has been encouraged to call the office with any questions or concerns that should arise related to todays visit.    Counseling:  Diabetes Counseling:  1. Addition of ACE inh/ ARB'S for nephroprotection. Microalbumin is updated  2. Diabetic foot care, prevention of complications. Podiatry consult 3. Exercise and lose weight.  4. Diabetic eye examination, Diabetic eye exam is updated  5. Monitor blood sugar closlely. nutrition counseling.  6. Sign and symptoms of hypoglycemia including shaking sweating,confusion and headaches.  This patient was seen by Vincent Gros FNP Collaboration with Dr Lyndon Code as a part of collaborative care agreement  Orders Placed This Encounter  Procedures  . POCT HgB A1C     Total time spent: 30 Minutes  Time spent includes review of chart, medications, test results, and follow up plan with the patient.     Lyndon Code, MD  Internal Medicine

## 2020-04-22 DIAGNOSIS — Z0001 Encounter for general adult medical examination with abnormal findings: Secondary | ICD-10-CM | POA: Insufficient documentation

## 2020-04-22 DIAGNOSIS — R300 Dysuria: Secondary | ICD-10-CM | POA: Insufficient documentation

## 2020-07-12 ENCOUNTER — Other Ambulatory Visit: Payer: Self-pay | Admitting: Nurse Practitioner

## 2020-07-12 DIAGNOSIS — Z8669 Personal history of other diseases of the nervous system and sense organs: Secondary | ICD-10-CM

## 2020-07-12 MED ORDER — PHENOBARBITAL 32.4 MG PO TABS: ORAL_TABLET | ORAL | 3 refills | Status: DC

## 2020-07-25 ENCOUNTER — Encounter: Payer: Self-pay | Admitting: Nurse Practitioner

## 2020-07-25 ENCOUNTER — Ambulatory Visit (INDEPENDENT_AMBULATORY_CARE_PROVIDER_SITE_OTHER): Payer: Medicare Other | Admitting: Nurse Practitioner

## 2020-07-25 VITALS — BP 128/73 | HR 97 | Temp 97.5°F | Resp 16 | Ht 62.0 in | Wt 220.0 lb

## 2020-07-25 DIAGNOSIS — E1165 Type 2 diabetes mellitus with hyperglycemia: Secondary | ICD-10-CM

## 2020-07-25 DIAGNOSIS — Z8669 Personal history of other diseases of the nervous system and sense organs: Secondary | ICD-10-CM | POA: Diagnosis not present

## 2020-07-25 DIAGNOSIS — I10 Essential (primary) hypertension: Secondary | ICD-10-CM | POA: Diagnosis not present

## 2020-07-25 DIAGNOSIS — Z23 Encounter for immunization: Secondary | ICD-10-CM

## 2020-07-25 LAB — POCT GLYCOSYLATED HEMOGLOBIN (HGB A1C): Hemoglobin A1C: 6.7 % — AB (ref 4.0–5.6)

## 2020-07-25 NOTE — Progress Notes (Signed)
[facility] [address] [place], [place] [address]  Internal MEDICINE  Office Visit Note  Patient Name: Brandon Knight  [id number]  [id number]  Date of Service: 08/30/2020  Chief Complaint  Patient presents with  . Follow-up  . Diabetes  . Hyperlipidemia  . Quality Metric Gaps    flu  . controlled substance form    reviewed with PT    The patient presents for routine follow up. Blood sugars are doing well. HgbA1c is 6.7 today. Tolerating medication changes well. No new concerns or complaints. Would like to get flu shot while he is here today.       Current Medication: Outpatient Encounter Medications as of 07/25/2020  Medication Sig  . Calcium Carb-Cholecalciferol (CALCIUM 500 + D3) 500-600 MG-UNIT TABS Take 1 tablet by mouth.  . chlorhexidine (PERIDEX) 0.12 % solution Use as directed 15 mLs in the mouth or throat 2 (two) times daily.  . cyanocobalamin (,VITAMIN B-12,) 1000 MCG/ML injection Inject 1,000 mcg into the muscle every 30 (thirty) days.  . folic acid (FOLVITE) 1 MG tablet Take 1 mg by mouth daily.  Marland Kitchen gemfibrozil (LOPID) 600 MG tablet Take 600 mg by mouth 2 (two) times daily before a meal.  . glucose blood (ACCU-CHEK AVIVA PLUS) test strip USE TO CHECK BLOOD SUGAR DAILY AS DIRECTED. Dx e11.65  . loratadine (CLARITIN) 10 MG tablet Take 10 mg by mouth daily.  Marland Kitchen LORazepam (ATIVAN) 0.5 MG tablet Take 0.5 mg by mouth 2 (two) times daily. Take 1 tab po BID prn  . metFORMIN (GLUCOPHAGE) 1000 MG tablet Take 1 tablet (1,000 mg total) by mouth 2 (two) times daily with a meal.  . metoprolol tartrate (LOPRESSOR) 25 MG tablet Take 25 mg by mouth 2 (two) times daily.  . naproxen (EC NAPROSYN) 500 MG EC tablet Take 500 mg by mouth 2 (two) times daily with a meal.  . omeprazole (PRILOSEC) 40 MG capsule Take 40 mg by mouth daily.  Marland Kitchen PHENobarbital (LUMINAL) 32.4 MG tablet Take 4 tab QHS  . phenytoin (DILANTIN) 100 MG ER capsule Take 4 capsules (400 mg total) by mouth  at bedtime.  . polyethylene glycol (MIRALAX / GLYCOLAX) packet Take 17 g by mouth daily.  . sertraline (ZOLOFT) 25 MG tablet Take 25 mg by mouth daily.  Marland Kitchen thioridazine (MELLARIL) 25 MG tablet Take 25 mg by mouth daily. Take 1 tab po am given by psych  . thioridazine (MELLARIL) 50 MG tablet Take 50 mg by mouth at bedtime. Take 1 tab qhs by psych   No facility-administered encounter medications on file as of 07/25/2020.    Surgical History: History reviewed. No pertinent surgical history.  Medical History: Past Medical History:  Diagnosis Date  . Diabetes mellitus without complication (HCC)   . Hyperlipidemia   . Seizures (HCC)     Family History: Family History  Family history unknown: Yes    Social History   Socioeconomic History  . Marital status: Single    Spouse name: Not on file  . Number of children: Not on file  . Years of education: Not on file  . Highest education level: Not on file  Occupational History  . Not on file  Tobacco Use  . Smoking status: Never Smoker  . Smokeless tobacco: Never Used  Vaping Use  . Vaping Use: Never used  Substance and Sexual Activity  . Alcohol use: No  . Drug use: No  . Sexual activity: Not on file  Other Topics  Concern  . Not on file  Social History Narrative  . Not on file   Social Determinants of Health   Financial Resource Strain: Not on file  Food Insecurity: Not on file  Transportation Needs: Not on file  Physical Activity: Not on file  Stress: Not on file  Social Connections: Not on file  Intimate Partner Violence: Not on file      Review of Systems  Constitutional: Negative for activity change, chills, fatigue and unexpected weight change.  HENT: Negative for congestion, postnasal drip, rhinorrhea, sneezing and sore throat.   Respiratory: Negative for cough, chest tightness, shortness of breath and wheezing.   Cardiovascular: Negative for chest pain and palpitations.  Gastrointestinal: Negative for  abdominal pain, constipation, diarrhea, nausea and vomiting.  Endocrine: Negative for cold intolerance, heat intolerance, polydipsia and polyuria.       Stable blood sugars   Genitourinary: Negative for dysuria, frequency and urgency.  Musculoskeletal: Negative for arthralgias, back pain, joint swelling and neck pain.  Skin: Negative for rash.  Allergic/Immunologic: Negative for environmental allergies.  Neurological: Positive for seizures. Negative for tremors and numbness.  Hematological: Negative for adenopathy. Does not bruise/bleed easily.  Psychiatric/Behavioral: Negative for behavioral problems (Depression), sleep disturbance and suicidal ideas. The patient is nervous/anxious.     Today's Vitals   07/25/20 1405  BP: 128/73  Pulse: 97  Resp: 16  Temp: (!) 97.5 F (36.4 C)  SpO2: 98%  Weight: 220 lb (99.8 kg)  Height: 5\' 2"  (1.575 m)   Body mass index is 40.24 kg/m.   Physical Exam Vitals and nursing note reviewed.  Constitutional:      General: He is not in acute distress.    Appearance: Normal appearance. He is well-developed. He is not diaphoretic.  HENT:     Head: Normocephalic and atraumatic.     Nose: Nose normal.     Mouth/Throat:     Pharynx: No oropharyngeal exudate.  Eyes:     Pupils: Pupils are equal, round, and reactive to light.  Neck:     Thyroid: No thyromegaly.     Vascular: No carotid bruit or JVD.     Trachea: No tracheal deviation.  Cardiovascular:     Rate and Rhythm: Normal rate and regular rhythm.     Pulses: Normal pulses.     Heart sounds: Normal heart sounds. No murmur heard. No friction rub. No gallop.   Pulmonary:     Effort: Pulmonary effort is normal. No respiratory distress.     Breath sounds: Normal breath sounds. No wheezing or rales.  Chest:     Chest wall: No tenderness.  Abdominal:     Palpations: Abdomen is soft.  Musculoskeletal:        General: Normal range of motion.     Cervical back: Normal range of motion and neck  supple.  Lymphadenopathy:     Cervical: No cervical adenopathy.  Skin:    General: Skin is warm and dry.  Neurological:     Mental Status: He is alert and oriented to person, place, and time. Mental status is at baseline.     Cranial Nerves: No cranial nerve deficit.     Comments: The patient is at his neurological baseline.   Psychiatric:        Mood and Affect: Mood is anxious.        Behavior: Behavior normal.        Thought Content: Thought content normal.  Judgment: Judgment normal.    Assessment/Plan: 1. Type 2 diabetes mellitus with hyperglycemia, without long-term current use of insulin (HCC) - POCT HgB A1C 6.7 today. Continue with metformin as prescribed   2. Essential hypertension Stable. Continue bp medication as prescribed  3. History of seizure disorder Well managed. Continue dilantin and phenobarbital as prescribed   4. Needs flu shot Flu vaccine administered today.  - Flu Vaccine MDCK QUAD PF  General Counseling: Davien verbalizes understanding of the findings of todays visit and agrees with plan of treatment. I have discussed any further diagnostic evaluation that may be needed or ordered today. We also reviewed his medications today. he has been encouraged to call the office with any questions or concerns that should arise related to todays visit.  This patient was seen by Vincent Gros FNP Collaboration with Dr Lyndon Code as a part of collaborative care agreement  Orders Placed This Encounter  Procedures  . Flu Vaccine MDCK QUAD PF  . POCT HgB A1C     Total time spent: 30 Minutes   Time spent includes review of chart, medications, test results, and follow up plan with the patient.      Dr Lyndon Code Internal medicine

## 2020-08-30 DIAGNOSIS — Z23 Encounter for immunization: Secondary | ICD-10-CM | POA: Insufficient documentation

## 2020-10-03 ENCOUNTER — Telehealth: Payer: Self-pay

## 2020-10-03 NOTE — Telephone Encounter (Signed)
Gave verbal order to [facility] [phone] for B12 once a month

## 2020-10-31 ENCOUNTER — Other Ambulatory Visit: Payer: Self-pay

## 2020-10-31 DIAGNOSIS — E11649 Type 2 diabetes mellitus with hypoglycemia without coma: Secondary | ICD-10-CM

## 2020-10-31 MED ORDER — ACCU-CHEK AVIVA PLUS VI STRP: ORAL_STRIP | 3 refills | Status: DC

## 2020-11-02 ENCOUNTER — Other Ambulatory Visit: Payer: Self-pay

## 2020-11-02 DIAGNOSIS — Z8669 Personal history of other diseases of the nervous system and sense organs: Secondary | ICD-10-CM

## 2020-11-02 MED ORDER — PHENOBARBITAL 32.4 MG PO TABS: ORAL_TABLET | ORAL | 3 refills | Status: DC

## 2020-11-14 ENCOUNTER — Telehealth: Payer: Self-pay

## 2020-11-14 DIAGNOSIS — Z8669 Personal history of other diseases of the nervous system and sense organs: Secondary | ICD-10-CM

## 2020-11-14 MED ORDER — PHENOBARBITAL 32.4 MG PO TABS: ORAL_TABLET | ORAL | 3 refills | Status: DC

## 2020-11-14 NOTE — Telephone Encounter (Signed)
Send med to [facility]ar  

## 2020-11-21 ENCOUNTER — Other Ambulatory Visit: Payer: Self-pay

## 2020-11-21 ENCOUNTER — Ambulatory Visit (INDEPENDENT_AMBULATORY_CARE_PROVIDER_SITE_OTHER): Payer: Medicare Other | Admitting: Hospice and Palliative Medicine

## 2020-11-21 ENCOUNTER — Encounter: Payer: Self-pay | Admitting: Hospice and Palliative Medicine

## 2020-11-21 VITALS — BP 138/74 | HR 100 | Temp 97.9°F | Resp 16 | Ht 62.0 in | Wt 214.8 lb

## 2020-11-21 DIAGNOSIS — Z8669 Personal history of other diseases of the nervous system and sense organs: Secondary | ICD-10-CM | POA: Diagnosis not present

## 2020-11-21 DIAGNOSIS — E1165 Type 2 diabetes mellitus with hyperglycemia: Secondary | ICD-10-CM | POA: Diagnosis not present

## 2020-11-21 DIAGNOSIS — I10 Essential (primary) hypertension: Secondary | ICD-10-CM | POA: Diagnosis not present

## 2020-11-21 LAB — POCT GLYCOSYLATED HEMOGLOBIN (HGB A1C): Hemoglobin A1C: 6.5 % — AB (ref 4.0–5.6)

## 2020-11-21 NOTE — Progress Notes (Signed)
[facility] [address] [place], [place] [address]  Internal MEDICINE  Office Visit Note  Patient Name: Brandon Knight  [id number]  [id number]  Date of Service: 11/22/2020  Chief Complaint  Patient presents with  . Diabetes    4 month f-up  . Hyperlipidemia    HPI Patient is here for routine follow-up Accompanied by his caregiver from long term care facility Reports things have been going well, no acute issues or complaints today Appetite remains good, no issues with constipation, sleeping well at night No recent seizures  Current Medication: Outpatient Encounter Medications as of 11/21/2020  Medication Sig  . Calcium Carb-Cholecalciferol 500-600 MG-UNIT TABS Take 1 tablet by mouth.  . chlorhexidine (PERIDEX) 0.12 % solution Use as directed 15 mLs in the mouth or throat 2 (two) times daily.  . cyanocobalamin (,VITAMIN B-12,) 1000 MCG/ML injection Inject 1,000 mcg into the muscle every 30 (thirty) days.  . folic acid (FOLVITE) 1 MG tablet Take 1 mg by mouth daily.  Marland Kitchen gemfibrozil (LOPID) 600 MG tablet Take 600 mg by mouth 2 (two) times daily before a meal.  . glucose blood (ACCU-CHEK AVIVA PLUS) test strip USE TO CHECK BLOOD SUGAR DAILY AS DIRECTED. Dx e11.65  . loratadine (CLARITIN) 10 MG tablet Take 10 mg by mouth daily.  Marland Kitchen LORazepam (ATIVAN) 0.5 MG tablet Take 0.5 mg by mouth 2 (two) times daily. Take 1 tab po BID prn  . metFORMIN (GLUCOPHAGE) 1000 MG tablet Take 1 tablet (1,000 mg total) by mouth 2 (two) times daily with a meal.  . metoprolol tartrate (LOPRESSOR) 25 MG tablet Take 25 mg by mouth 2 (two) times daily.  . naproxen (EC NAPROSYN) 500 MG EC tablet Take 500 mg by mouth 2 (two) times daily with a meal.  . omeprazole (PRILOSEC) 40 MG capsule Take 40 mg by mouth daily.  Marland Kitchen PHENobarbital (LUMINAL) 32.4 MG tablet Take 4 tab QHS  . phenytoin (DILANTIN) 100 MG ER capsule Take 4 capsules (400 mg total) by mouth at bedtime.  . polyethylene glycol (MIRALAX /  GLYCOLAX) packet Take 17 g by mouth daily.  . sertraline (ZOLOFT) 25 MG tablet Take 25 mg by mouth daily.  Marland Kitchen thioridazine (MELLARIL) 25 MG tablet Take 25 mg by mouth daily. Take 1 tab po am given by psych  . thioridazine (MELLARIL) 50 MG tablet Take 50 mg by mouth at bedtime. Take 1 tab qhs by psych   No facility-administered encounter medications on file as of 11/21/2020.    Surgical History: Past Surgical History:  Procedure Laterality Date  . NO PAST SURGERIES      Medical History: Past Medical History:  Diagnosis Date  . Diabetes mellitus without complication (HCC)   . Hyperlipidemia   . Seizures (HCC)     Family History: Family History  Family history unknown: Yes    Social History   Socioeconomic History  . Marital status: Single    Spouse name: Not on file  . Number of children: Not on file  . Years of education: Not on file  . Highest education level: Not on file  Occupational History  . Not on file  Tobacco Use  . Smoking status: Never Smoker  . Smokeless tobacco: Never Used  Vaping Use  . Vaping Use: Never used  Substance and Sexual Activity  . Alcohol use: No  . Drug use: No  . Sexual activity: Not on file  Other Topics Concern  . Not on file  Social History Narrative  .  Not on file   Social Determinants of Health   Financial Resource Strain: Not on file  Food Insecurity: Not on file  Transportation Needs: Not on file  Physical Activity: Not on file  Stress: Not on file  Social Connections: Not on file  Intimate Partner Violence: Not on file      Review of Systems  Constitutional: Negative for chills, fatigue and unexpected weight change.  HENT: Negative for congestion, postnasal drip, rhinorrhea, sneezing and sore throat.   Eyes: Negative for redness.  Respiratory: Negative for cough, chest tightness and shortness of breath.   Cardiovascular: Negative for chest pain and palpitations.  Gastrointestinal: Negative for abdominal pain,  constipation, diarrhea, nausea and vomiting.  Genitourinary: Negative for dysuria and frequency.  Musculoskeletal: Negative for arthralgias, back pain, joint swelling and neck pain.  Skin: Negative for rash.  Neurological: Negative for tremors and numbness.  Hematological: Negative for adenopathy. Does not bruise/bleed easily.  Psychiatric/Behavioral: Negative for behavioral problems (Depression), sleep disturbance and suicidal ideas. The patient is not nervous/anxious.     Vital Signs: BP 138/74   Pulse 100   Temp 97.9 F (36.6 C)   Resp 16   Ht 5\' 2"  (1.575 m)   Wt 214 lb 12.8 oz (97.4 kg)   SpO2 99%   BMI 39.29 kg/m    Physical Exam Vitals reviewed.  Constitutional:      Appearance: Normal appearance. He is obese.  Cardiovascular:     Rate and Rhythm: Normal rate and regular rhythm.     Pulses: Normal pulses.     Heart sounds: Normal heart sounds.  Pulmonary:     Effort: Pulmonary effort is normal.     Breath sounds: Normal breath sounds.  Abdominal:     General: Abdomen is flat.     Palpations: Abdomen is soft.  Musculoskeletal:        General: Normal range of motion.     Cervical back: Normal range of motion.  Skin:    General: Skin is warm.  Neurological:     General: No focal deficit present.     Mental Status: He is alert and oriented to person, place, and time. Mental status is at baseline.  Psychiatric:        Mood and Affect: Mood normal.        Behavior: Behavior normal.        Thought Content: Thought content normal.        Judgment: Judgment normal.    Assessment/Plan: 1. Type 2 diabetes mellitus with hyperglycemia, without long-term current use of insulin (HCC) A1C 6.5, well controlled, continue with current therapy - POCT HgB A1C  2. History of seizure disorder Stable, continue to monitor  3. Essential hypertension BP and HR well controlled, continue to monitor  General Counseling: Geraldo verbalizes understanding of the findings of todays  visit and agrees with plan of treatment. I have discussed any further diagnostic evaluation that may be needed or ordered today. We also reviewed his medications today. he has been encouraged to call the office with any questions or concerns that should arise related to todays visit.    Orders Placed This Encounter  Procedures  . POCT HgB A1C    Time spent: 30 Minutes Time spent includes review of chart, medications, test results and follow-up plan with the patient.  This patient was seen by AGNP-C in Collaboration with Dr Leeanne Deed as a part of collaborative care agreement     [facility]  Doreene Adas AGNP-C Internal medicine

## 2020-11-22 ENCOUNTER — Encounter: Payer: Self-pay | Admitting: Hospice and Palliative Medicine

## 2021-02-22 ENCOUNTER — Other Ambulatory Visit: Payer: Self-pay

## 2021-02-22 DIAGNOSIS — Z8669 Personal history of other diseases of the nervous system and sense organs: Secondary | ICD-10-CM

## 2021-02-22 MED ORDER — PHENOBARBITAL 32.4 MG PO TABS: ORAL_TABLET | ORAL | 3 refills | Status: DC

## 2021-02-22 NOTE — Telephone Encounter (Signed)
Faxed pres for phenobarbarbital 32.4 mg 112 with 3 refills

## 2021-03-06 ENCOUNTER — Telehealth: Payer: Self-pay

## 2021-03-06 ENCOUNTER — Other Ambulatory Visit: Payer: Self-pay

## 2021-03-06 DIAGNOSIS — Z8669 Personal history of other diseases of the nervous system and sense organs: Secondary | ICD-10-CM

## 2021-03-07 NOTE — Telephone Encounter (Signed)
Ok to refill 

## 2021-03-08 ENCOUNTER — Other Ambulatory Visit: Payer: Self-pay

## 2021-03-08 DIAGNOSIS — Z8669 Personal history of other diseases of the nervous system and sense organs: Secondary | ICD-10-CM

## 2021-03-08 MED ORDER — PHENOBARBITAL 32.4 MG PO TABS: ORAL_TABLET | ORAL | 3 refills | Status: DC

## 2021-03-08 NOTE — Telephone Encounter (Signed)
Refill done, spoke to pt, informed her to check pharmacy

## 2021-03-15 ENCOUNTER — Other Ambulatory Visit: Payer: Self-pay | Admitting: Internal Medicine

## 2021-03-15 ENCOUNTER — Other Ambulatory Visit: Payer: Self-pay | Admitting: Nurse Practitioner

## 2021-03-15 ENCOUNTER — Telehealth: Payer: Self-pay

## 2021-03-15 DIAGNOSIS — Z8669 Personal history of other diseases of the nervous system and sense organs: Secondary | ICD-10-CM

## 2021-03-15 NOTE — Telephone Encounter (Signed)
[place] and spoke with taylor and cancelled sertraline 25 pt gets that med by psychiatrist please send to right dr

## 2021-03-29 ENCOUNTER — Encounter: Payer: Self-pay | Admitting: Nurse Practitioner

## 2021-03-29 ENCOUNTER — Other Ambulatory Visit: Payer: Self-pay

## 2021-03-29 ENCOUNTER — Encounter (INDEPENDENT_AMBULATORY_CARE_PROVIDER_SITE_OTHER): Payer: Self-pay

## 2021-03-29 ENCOUNTER — Ambulatory Visit (INDEPENDENT_AMBULATORY_CARE_PROVIDER_SITE_OTHER): Payer: Medicare Other | Admitting: Nurse Practitioner

## 2021-03-29 VITALS — BP 136/84 | HR 94 | Temp 98.3°F | Resp 16 | Ht 65.0 in | Wt 219.0 lb

## 2021-03-29 DIAGNOSIS — E559 Vitamin D deficiency, unspecified: Secondary | ICD-10-CM

## 2021-03-29 DIAGNOSIS — E782 Mixed hyperlipidemia: Secondary | ICD-10-CM | POA: Diagnosis not present

## 2021-03-29 DIAGNOSIS — I10 Essential (primary) hypertension: Secondary | ICD-10-CM | POA: Diagnosis not present

## 2021-03-29 DIAGNOSIS — Z1211 Encounter for screening for malignant neoplasm of colon: Secondary | ICD-10-CM

## 2021-03-29 DIAGNOSIS — Z0001 Encounter for general adult medical examination with abnormal findings: Secondary | ICD-10-CM

## 2021-03-29 DIAGNOSIS — E1165 Type 2 diabetes mellitus with hyperglycemia: Secondary | ICD-10-CM | POA: Diagnosis not present

## 2021-03-29 DIAGNOSIS — R300 Dysuria: Secondary | ICD-10-CM

## 2021-03-29 DIAGNOSIS — Z0189 Encounter for other specified special examinations: Secondary | ICD-10-CM | POA: Diagnosis not present

## 2021-03-29 DIAGNOSIS — F411 Generalized anxiety disorder: Secondary | ICD-10-CM

## 2021-03-29 DIAGNOSIS — Z8669 Personal history of other diseases of the nervous system and sense organs: Secondary | ICD-10-CM

## 2021-03-29 DIAGNOSIS — E785 Hyperlipidemia, unspecified: Secondary | ICD-10-CM

## 2021-03-29 LAB — POCT GLYCOSYLATED HEMOGLOBIN (HGB A1C): Hemoglobin A1C: 6.6 % — AB (ref 4.0–5.6)

## 2021-03-29 NOTE — Progress Notes (Signed)
[facility] [place], [place] [address]  Internal MEDICINE  Office Visit Note  Patient Name: Brandon Knight  [id number]  [id number]  Date of Service: 03/29/2021  Chief Complaint  Patient presents with   Annual Exam    HPI Brandon Knight presents for an annual well visit and physical exam. he has a history of anxiety, diabetes, GERD, hyperlipidemia, hypertension, and seizure disorder. He has had 2 doses of the COVID vaccine and 2 booster doses. He lives in an assisted living facility and is accompanied by [place] who is Dealer of the assisted living facility. He denies any pain at this time.  A1C has improved since last time it was checked (6.6 today). No change in current medications Blood pressure is well controlled with current medications.  -Eye exam scheduled 04/19/21 -foot exam is due today.  -due for colorectal cancer screening.  Several years since last seizure- on maintenance medication.  -patient declined providing a urine specimen, unable to check urine microalbumin today  Current Medication: Outpatient Encounter Medications as of 03/29/2021  Medication Sig   Calcium Carb-Cholecalciferol 500-600 MG-UNIT TABS Take 1 tablet by mouth.   chlorhexidine (PERIDEX) 0.12 % solution Use as directed 15 mLs in the mouth or throat 2 (two) times daily.   cyanocobalamin (,VITAMIN B-12,) 1000 MCG/ML injection Inject 1,000 mcg into the muscle every 30 (thirty) days.   folic acid (FOLVITE) 1 MG tablet Take 1 mg by mouth daily.   gemfibrozil (LOPID) 600 MG tablet Take 600 mg by mouth 2 (two) times daily before a meal.   glucose blood (ACCU-CHEK AVIVA PLUS) test strip USE TO CHECK BLOOD SUGAR DAILY AS DIRECTED. Dx e11.65   loratadine (CLARITIN) 10 MG tablet Take 10 mg by mouth daily.   LORazepam (ATIVAN) 0.5 MG tablet Take 0.5 mg by mouth 2 (two) times daily. Take 1 tab po BID prn   metFORMIN (GLUCOPHAGE) 1000 MG tablet Take 1 tablet (1,000 mg total) by mouth 2 (two)  times daily with a meal.   metoprolol tartrate (LOPRESSOR) 25 MG tablet Take 25 mg by mouth 2 (two) times daily.   naproxen (EC NAPROSYN) 500 MG EC tablet Take 500 mg by mouth 2 (two) times daily with a meal.   omeprazole (PRILOSEC) 40 MG capsule Take 40 mg by mouth daily.   PHENobarbital (LUMINAL) 32.4 MG tablet Take 4 tab QHS   phenytoin (DILANTIN) 100 MG ER capsule Take 4 capsules (400 mg total) by mouth at bedtime.   polyethylene glycol (MIRALAX / GLYCOLAX) packet Take 17 g by mouth daily.   sertraline (ZOLOFT) 25 MG tablet Take 25 mg by mouth daily.   sertraline (ZOLOFT) 50 MG tablet TAKE 1 TABLET BY MOUTH ONCE DAILY   thioridazine (MELLARIL) 25 MG tablet Take 25 mg by mouth daily. Take 1 tab po am given by psych   thioridazine (MELLARIL) 50 MG tablet Take 50 mg by mouth at bedtime. Take 1 tab qhs by psych   No facility-administered encounter medications on file as of 03/29/2021.    Surgical History: Past Surgical History:  Procedure Laterality Date   NO PAST SURGERIES      Medical History: Past Medical History:  Diagnosis Date   Diabetes mellitus without complication ([place])    Hyperlipidemia    Seizures ([place])     Family History: Family History  Family history unknown: Yes    Social History   Socioeconomic History   Marital status: Single    Spouse name: Not on  file   Number of children: Not on file   Years of education: Not on file   Highest education level: Not on file  Occupational History   Not on file  Tobacco Use   Smoking status: Never   Smokeless tobacco: Never  Vaping Use   Vaping Use: Never used  Substance and Sexual Activity   Alcohol use: No   Drug use: No   Sexual activity: Not on file  Other Topics Concern   Not on file  Social History Narrative   Not on file   Social Determinants of Health   Financial Resource Strain: Not on file  Food Insecurity: Not on file  Transportation Needs: Not on file  Physical Activity: Not on file  Stress:  Not on file  Social Connections: Not on file  Intimate Partner Violence: Not on file      Review of Systems  Constitutional:  Negative for activity change, appetite change, chills, fatigue, fever and unexpected weight change.  HENT: Negative.  Negative for congestion, ear pain, rhinorrhea, sore throat and trouble swallowing.   Eyes: Negative.   Respiratory: Negative.  Negative for cough, chest tightness, shortness of breath and wheezing.   Cardiovascular: Negative.  Negative for chest pain.  Gastrointestinal: Negative.  Negative for abdominal pain, blood in stool, constipation, diarrhea, nausea and vomiting.  Endocrine: Negative.   Genitourinary: Negative.  Negative for difficulty urinating, dysuria, frequency, hematuria and urgency.  Musculoskeletal: Negative.  Negative for arthralgias, back pain, joint swelling, myalgias and neck pain.  Skin: Negative.  Negative for rash and wound.  Allergic/Immunologic: Negative.  Negative for immunocompromised state.  Neurological: Negative.  Negative for dizziness, seizures, numbness and headaches.  Hematological: Negative.   Psychiatric/Behavioral:  Positive for agitation, behavioral problems and dysphoric mood. Negative for self-injury and suicidal ideas. The patient is nervous/anxious.    Vital Signs: BP 136/84   Pulse 94   Temp 98.3 F (36.8 C)   Resp 16   Ht '5\' 5"'  (1.651 m)   Wt 219 lb (99.3 kg)   SpO2 96%   BMI 36.44 kg/m    Physical Exam Vitals reviewed.  Constitutional:      General: He is not in acute distress.    Appearance: Normal appearance. He is well-developed. He is obese. He is not ill-appearing or toxic-appearing.  HENT:     Head: Normocephalic and atraumatic.     Right Ear: Tympanic membrane, ear canal and external ear normal.     Left Ear: Tympanic membrane, ear canal and external ear normal.     Nose: Nose normal.     Mouth/Throat:     Mouth: Mucous membranes are moist.     Pharynx: Oropharynx is clear. No  oropharyngeal exudate or posterior oropharyngeal erythema.  Eyes:     Extraocular Movements: Extraocular movements intact.     Conjunctiva/sclera: Conjunctivae normal.     Pupils: Pupils are equal, round, and reactive to light.  Neck:     Vascular: No carotid bruit.  Cardiovascular:     Rate and Rhythm: Normal rate and regular rhythm.     Pulses: Normal pulses.          Dorsalis pedis pulses are 2+ on the right side and 2+ on the left side.       Posterior tibial pulses are 2+ on the right side and 2+ on the left side.     Heart sounds: Normal heart sounds. No murmur heard.   No friction rub. No gallop.  Pulmonary:     Effort: Pulmonary effort is normal. No respiratory distress.     Breath sounds: Normal breath sounds. No wheezing.  Abdominal:     General: Bowel sounds are normal. There is no distension.     Palpations: Abdomen is soft.     Tenderness: There is no abdominal tenderness. There is no guarding.  Musculoskeletal:     Cervical back: Normal range of motion and neck supple.     Right foot: Normal range of motion. No deformity, bunion or foot drop.     Left foot: Normal range of motion. No deformity, bunion or foot drop.  Feet:     Right foot:     Protective Sensation: 6 sites tested.  6 sites sensed.     Skin integrity: Skin integrity normal. No ulcer, blister, skin breakdown, erythema, warmth, callus, dry skin or fissure.     Toenail Condition: Right toenails are abnormally thick.     Left foot:     Protective Sensation: 6 sites tested.  6 sites sensed.     Skin integrity: Skin integrity normal. No ulcer, blister, skin breakdown, erythema, warmth, callus, dry skin or fissure.     Toenail Condition: Left toenails are abnormally thick.  Lymphadenopathy:     Cervical: No cervical adenopathy.  Skin:    General: Skin is warm and dry.     Capillary Refill: Capillary refill takes less than 2 seconds.  Neurological:     Mental Status: He is alert. Mental status is at  baseline.     Cranial Nerves: No facial asymmetry.     Gait: Gait is intact. Gait normal.     Comments: Nonverbal, unable to assess orientation  Psychiatric:        Attention and Perception: He is inattentive. He does not perceive auditory or visual hallucinations.        Mood and Affect: Mood is not anxious, depressed or elated. Affect is flat. Affect is not blunt.        Speech: He is noncommunicative (nonverbal, grunting and some mumbling). Speech is delayed.        Behavior: Behavior is slowed and withdrawn. Behavior is not agitated (not agitated today but can become easily agitated and aggressive at times per caregiver report.), hyperactive or combative. Behavior is cooperative.        Thought Content: Thought content is not delusional (unable to assess, patient only grunts, mumbles and sometimes answered yes or no.). Thought content does not include homicidal or suicidal ideation.        Cognition and Memory: Cognition is impaired.        Judgment: Judgment is impulsive (per caregiver report) and inappropriate (per caregiver report).     Assessment/Plan: 1. Encounter for general adult medical examination with abnormal findings Age-appropriate preventive screenings and vaccinations discussed, annual physical exam completed. Routine labs for health maintenance ordered as seen below. PHM updated.   2. Encounter for routine laboratory testing Routine labs ordered, will call with results.  - CBC with Differential/Platelet - TSH + free T4 - Lipid Profile - CMP14+EGFR - Vitamin D (25 hydroxy) - Phenobarbital level  3. Type 2 diabetes mellitus with hyperglycemia, without long-term current use of insulin (HCC) A1C is 6.6 which is improved from 7.0 last year. Will recheck in 4 months. No change to medications, other labs ordered for routine health maintenance.  - CBC with Differential/Platelet - TSH + free T4 - CMP14+EGFR - POCT glycosylated hemoglobin (Hb A1C)  4. Essential  hypertension Stable with current medications  5. GAD (generalized anxiety disorder) Stable, no changes made today, no refills needed.  6. Mixed hyperlipidemia Need to recheck levels, lipid panel ordered.  - Lipid Profile  7. Vitamin D deficiency Check vitamin D level to rule out vitamin D deficiency - Vitamin D (25 hydroxy)  8. History of seizure disorder Has not had seizure in several years, is on maintenance dose of phenobarbital, level was checked last year, will recheck phenobarbital level.  - Phenobarbital level  9. Screening for colon cancer Routine colonoscopy for colorectal cancer screening is due, referral order placed.  - Ambulatory referral to Gastroenterology   General Counseling: Spence verbalizes understanding of the findings of todays visit and agrees with plan of treatment. I have discussed any further diagnostic evaluation that may be needed or ordered today. We also reviewed his medications today. he has been encouraged to call the office with any questions or concerns that should arise related to todays visit.    Orders Placed This Encounter  Procedures   CBC with Differential/Platelet   TSH + free T4   Lipid Profile   CMP14+EGFR   Vitamin D (25 hydroxy)   Phenobarbital level   Ambulatory referral to Gastroenterology   POCT glycosylated hemoglobin (Hb A1C)    No orders of the defined types were placed in this encounter.   Return in about 4 months (around 07/30/2021) for F/U, Recheck A1C, Jarod Bozzo PCP.   Total time spent:30 Minutes Time spent includes review of chart, medications, test results, and follow up plan with the patient.   [place] Controlled Substance Database was reviewed by me.  This patient was seen by Jonetta Osgood, FNP-C in collaboration with Dr. Clayborn Bigness as a part of collaborative care agreement.  Arhaan Chesnut R. Valetta Fuller, MSN, FNP-C Internal medicine

## 2021-04-06 ENCOUNTER — Encounter: Payer: Self-pay | Admitting: Nurse Practitioner

## 2021-04-18 ENCOUNTER — Other Ambulatory Visit: Payer: Self-pay

## 2021-04-18 DIAGNOSIS — E1165 Type 2 diabetes mellitus with hyperglycemia: Secondary | ICD-10-CM

## 2021-04-18 DIAGNOSIS — Z8669 Personal history of other diseases of the nervous system and sense organs: Secondary | ICD-10-CM

## 2021-04-18 MED ORDER — METFORMIN HCL 1000 MG PO TABS: 1000.0000 mg | ORAL_TABLET | Freq: Two times a day (BID) | ORAL | 3 refills | Status: DC

## 2021-04-18 MED ORDER — PHENYTOIN SODIUM EXTENDED 100 MG PO CAPS: 400.0000 mg | ORAL_CAPSULE | Freq: Every day | ORAL | 3 refills | Status: DC

## 2021-04-18 MED ORDER — OMEPRAZOLE 40 MG PO CPDR: 40.0000 mg | DELAYED_RELEASE_CAPSULE | Freq: Every day | ORAL | 5 refills | Status: DC

## 2021-04-18 MED ORDER — OMEPRAZOLE 40 MG PO CPDR: 40.0000 mg | DELAYED_RELEASE_CAPSULE | Freq: Every day | ORAL | 1 refills | Status: DC

## 2021-05-08 ENCOUNTER — Other Ambulatory Visit: Payer: Self-pay | Admitting: Internal Medicine

## 2021-05-18 ENCOUNTER — Other Ambulatory Visit: Payer: Self-pay | Admitting: Nurse Practitioner

## 2021-05-18 DIAGNOSIS — Z8669 Personal history of other diseases of the nervous system and sense organs: Secondary | ICD-10-CM

## 2021-05-18 MED ORDER — PHENOBARBITAL 32.4 MG PO TABS: ORAL_TABLET | ORAL | 3 refills | Status: DC

## 2021-05-25 ENCOUNTER — Other Ambulatory Visit: Payer: Self-pay

## 2021-05-25 MED ORDER — CYANOCOBALAMIN 1000 MCG/ML IJ SOLN: 1000.0000 ug | INTRAMUSCULAR | 3 refills | Status: DC

## 2021-07-30 ENCOUNTER — Ambulatory Visit (INDEPENDENT_AMBULATORY_CARE_PROVIDER_SITE_OTHER): Payer: Medicare Other | Admitting: Nurse Practitioner

## 2021-07-30 ENCOUNTER — Encounter: Payer: Self-pay | Admitting: Nurse Practitioner

## 2021-07-30 VITALS — BP 124/66 | HR 105 | Temp 98.0°F | Resp 16 | Ht 65.0 in | Wt 228.6 lb

## 2021-07-30 DIAGNOSIS — Z9989 Dependence on other enabling machines and devices: Secondary | ICD-10-CM | POA: Diagnosis not present

## 2021-07-30 DIAGNOSIS — G4733 Obstructive sleep apnea (adult) (pediatric): Secondary | ICD-10-CM | POA: Diagnosis not present

## 2021-07-30 DIAGNOSIS — E1165 Type 2 diabetes mellitus with hyperglycemia: Secondary | ICD-10-CM | POA: Diagnosis not present

## 2021-07-30 DIAGNOSIS — R051 Acute cough: Secondary | ICD-10-CM | POA: Diagnosis not present

## 2021-07-30 LAB — POCT GLYCOSYLATED HEMOGLOBIN (HGB A1C): Hemoglobin A1C: 9.6 % — AB (ref 4.0–5.6)

## 2021-07-30 MED ORDER — BENZONATATE 100 MG PO CAPS: 100.0000 mg | ORAL_CAPSULE | Freq: Two times a day (BID) | ORAL | 0 refills | Status: DC | PRN

## 2021-07-30 MED ORDER — INSULIN GLARGINE (2 UNIT DIAL) 300 UNIT/ML ~~LOC~~ SOPN: 10.0000 [IU] | PEN_INJECTOR | Freq: Every day | SUBCUTANEOUS | 0 refills | Status: DC

## 2021-07-30 NOTE — Progress Notes (Signed)
[facility] [address] [place], [place] [address]  Internal MEDICINE  Office Visit Note  Patient Name: Brandon Knight  [id number]  [id number]  Date of Service: 07/30/2021  Chief Complaint  Patient presents with   Follow-up   Diabetes   Gastroesophageal Reflux   Hyperlipidemia   Hypertension   Anxiety   Cough    Noticed today    HPI Brandon Knight presents for a follow up visit for diabetes, hypertension and cough. His glucose levels have ranged often 200s - 300s per his caregiver who accompanies him today. His A1C was checked today and is 9.6. This is a significant increase from 6.6 in July. His only medication for diabetes is metformin 1000 mg twice daily with meals.  His blood pressure remains well controlled. He needs an appointment with Tresa Endo for his CPAP use.  He has a dry cough but no other symptoms presently.     Current Medication: Outpatient Encounter Medications as of 07/30/2021  Medication Sig   benzonatate (TESSALON) 100 MG capsule Take 1 capsule (100 mg total) by mouth 2 (two) times daily as needed for cough.   Calcium Carb-Cholecalciferol 500-600 MG-UNIT TABS Take 1 tablet by mouth.   chlorhexidine (PERIDEX) 0.12 % solution Use as directed 15 mLs in the mouth or throat 2 (two) times daily.   cyanocobalamin (,VITAMIN B-12,) 1000 MCG/ML injection Inject 1 mL (1,000 mcg total) into the muscle every 30 (thirty) days.   folic acid (FOLVITE) 1 MG tablet Take 1 mg by mouth daily.   gemfibrozil (LOPID) 600 MG tablet Take 600 mg by mouth 2 (two) times daily before a meal.   glucose blood (ACCU-CHEK AVIVA PLUS) test strip USE TO CHECK BLOOD SUGAR DAILY AS DIRECTED. Dx e11.65   insulin glargine, 2 Unit Dial, (TOUJEO MAX) 300 UNIT/ML Solostar Pen Inject 10 Units into the skin daily before breakfast.   loratadine (CLARITIN) 10 MG tablet Take 10 mg by mouth daily.   LORazepam (ATIVAN) 0.5 MG tablet Take 0.5 mg by mouth 2 (two) times daily. Take 1 tab po BID prn    metFORMIN (GLUCOPHAGE) 1000 MG tablet Take 1 tablet (1,000 mg total) by mouth 2 (two) times daily with a meal.   metoprolol tartrate (LOPRESSOR) 25 MG tablet TAKE 1 TABLET BY MOUTH TWICE A DAY   naproxen (EC NAPROSYN) 500 MG EC tablet Take 500 mg by mouth 2 (two) times daily with a meal.   omeprazole (PRILOSEC) 40 MG capsule Take 1 capsule (40 mg total) by mouth daily.   PHENobarbital (LUMINAL) 32.4 MG tablet Take 4 tab QHS   phenytoin (DILANTIN) 100 MG ER capsule Take 4 capsules (400 mg total) by mouth at bedtime.   polyethylene glycol (MIRALAX / GLYCOLAX) packet Take 17 g by mouth daily.   sertraline (ZOLOFT) 25 MG tablet Take 25 mg by mouth daily.   sertraline (ZOLOFT) 50 MG tablet TAKE 1 TABLET BY MOUTH ONCE DAILY   thioridazine (MELLARIL) 25 MG tablet Take 25 mg by mouth daily. Take 1 tab po am given by psych   thioridazine (MELLARIL) 50 MG tablet Take 50 mg by mouth at bedtime. Take 1 tab qhs by psych   No facility-administered encounter medications on file as of 07/30/2021.    Surgical History: Past Surgical History:  Procedure Laterality Date   NO PAST SURGERIES      Medical History: Past Medical History:  Diagnosis Date   Anxiety    Diabetes mellitus without complication (HCC)    GERD (  gastroesophageal reflux disease)    Hyperlipidemia    Hypertension    Seizures (HCC)     Family History: Family History  Family history unknown: Yes    Social History   Socioeconomic History   Marital status: Single    Spouse name: Not on file   Number of children: Not on file   Years of education: Not on file   Highest education level: Not on file  Occupational History   Not on file  Tobacco Use   Smoking status: Never   Smokeless tobacco: Never  Vaping Use   Vaping Use: Never used  Substance and Sexual Activity   Alcohol use: No   Drug use: No   Sexual activity: Not on file  Other Topics Concern   Not on file  Social History Narrative   Not on file   Social  Determinants of Health   Financial Resource Strain: Not on file  Food Insecurity: Not on file  Transportation Needs: Not on file  Physical Activity: Not on file  Stress: Not on file  Social Connections: Not on file  Intimate Partner Violence: Not on file      Review of Systems  Constitutional:  Negative for chills, fatigue and unexpected weight change.  HENT:  Negative for congestion, rhinorrhea, sneezing and sore throat.   Eyes:  Negative for redness.  Respiratory:  Positive for cough. Negative for chest tightness and shortness of breath.   Cardiovascular:  Negative for chest pain and palpitations.  Gastrointestinal:  Negative for abdominal pain, constipation, diarrhea, nausea and vomiting.  Genitourinary:  Negative for dysuria and frequency.  Musculoskeletal:  Negative for arthralgias, back pain, joint swelling and neck pain.  Skin:  Negative for rash.  Neurological: Negative.  Negative for tremors and numbness.  Hematological:  Negative for adenopathy. Does not bruise/bleed easily.  Psychiatric/Behavioral:  Negative for behavioral problems (Depression), sleep disturbance and suicidal ideas. The patient is not nervous/anxious.    Vital Signs: BP 124/66   Pulse (!) 105   Temp 98 F (36.7 C)   Resp 16   Ht 5\' 5"  (1.651 m)   Wt 228 lb 9.6 oz (103.7 kg)   SpO2 95%   BMI 38.04 kg/m    Physical Exam Constitutional:      General: He is not in acute distress.    Appearance: Normal appearance. He is obese. He is not ill-appearing.  HENT:     Head: Normocephalic and atraumatic.     Right Ear: Tympanic membrane, ear canal and external ear normal.     Left Ear: Tympanic membrane, ear canal and external ear normal.     Nose: Nose normal. No congestion or rhinorrhea.     Mouth/Throat:     Mouth: Mucous membranes are moist.     Pharynx: Oropharynx is clear. No oropharyngeal exudate or posterior oropharyngeal erythema.  Eyes:     Pupils: Pupils are equal, round, and reactive to  light.  Cardiovascular:     Rate and Rhythm: Normal rate and regular rhythm.     Pulses: Normal pulses.     Heart sounds: Normal heart sounds. No murmur heard. Pulmonary:     Effort: Pulmonary effort is normal. No respiratory distress.     Breath sounds: Normal breath sounds. No wheezing.  Neurological:     Mental Status: He is alert and oriented to person, place, and time.     Cranial Nerves: No cranial nerve deficit.     Coordination: Coordination normal.  Gait: Gait normal.  Psychiatric:        Mood and Affect: Mood normal.        Behavior: Behavior normal.       Assessment/Plan: 1. Uncontrolled type 2 diabetes mellitus with hyperglycemia, without long-term current use of insulin (HCC) A1C significantly elevated at 9.6. toujeo baal insulin 10 units daily added. Pen needles ordered as well. Follow up in 1 month - POCT HgB A1C - insulin glargine, 2 Unit Dial, (TOUJEO MAX) 300 UNIT/ML Solostar Pen; Inject 10 Units into the skin daily before breakfast.  Dispense: 3 mL; Refill: 0 - Insulin Pen Needle 32G X 6 MM MISC; 1 Device by Does not apply route daily.  Dispense: 50 each; Refill: 3  2. OSA on CPAP Needs appt with Tresa Endo for CPAP  3. Acute cough Medication prescribed for symptomatic relief of cough. Instructed caregiver to call clinic if he develops any other symptoms.  - benzonatate (TESSALON) 100 MG capsule; Take 1 capsule (100 mg total) by mouth 2 (two) times daily as needed for cough.  Dispense: 30 capsule; Refill: 0   General Counseling: Tammy verbalizes understanding of the findings of todays visit and agrees with plan of treatment. I have discussed any further diagnostic evaluation that may be needed or ordered today. We also reviewed his medications today. he has been encouraged to call the office with any questions or concerns that should arise related to todays visit.    Orders Placed This Encounter  Procedures   POCT HgB A1C    Meds ordered this encounter   Medications   insulin glargine, 2 Unit Dial, (TOUJEO MAX) 300 UNIT/ML Solostar Pen    Sig: Inject 10 Units into the skin daily before breakfast.    Dispense:  3 mL    Refill:  0   benzonatate (TESSALON) 100 MG capsule    Sig: Take 1 capsule (100 mg total) by mouth 2 (two) times daily as needed for cough.    Dispense:  30 capsule    Refill:  0    Return in about 1 month (around 08/29/2021) for F/U, eval new med, Domonique Brouillard PCP was started on basal insulin; needs appt with kelly for cpap.   Total time spent:30 Minutes Time spent includes review of chart, medications, test results, and follow up plan with the patient.   [place] Controlled Substance Database was reviewed by me.  This patient was seen by Sallyanne Kuster, FNP-C in collaboration with Dr. Beverely Risen as a part of collaborative care agreement.   Lasharon Dunivan R. Tedd Sias, MSN, FNP-C Internal medicine

## 2021-08-22 ENCOUNTER — Other Ambulatory Visit: Payer: Self-pay

## 2021-08-22 ENCOUNTER — Ambulatory Visit (INDEPENDENT_AMBULATORY_CARE_PROVIDER_SITE_OTHER): Payer: Medicare Other

## 2021-08-22 ENCOUNTER — Telehealth: Payer: Self-pay

## 2021-08-22 ENCOUNTER — Encounter: Payer: Self-pay | Admitting: Nurse Practitioner

## 2021-08-22 DIAGNOSIS — G4733 Obstructive sleep apnea (adult) (pediatric): Secondary | ICD-10-CM | POA: Diagnosis not present

## 2021-08-22 MED ORDER — INSULIN PEN NEEDLE 32G X 6 MM MISC: 1.0000 | Freq: Every day | 3 refills | Status: DC

## 2021-08-22 NOTE — Progress Notes (Signed)
95 percentile pressure 15   95th percentile leak 64.8   apnea index 33.4 /hr  apnea-hypopnea index  34.4 /hr   total days used  >4 hr 90 days  total days used <4 hr 0 days  Total compliance 100 percent Pt was seen by Tresa Endo  RRT/RCP  from [facility]

## 2021-08-22 NOTE — Telephone Encounter (Signed)
-----   Message from Sallyanne Kuster, NP sent at 08/22/2021  6:23 AM EST ----- Regarding: insulin pen needles Please call patient's caregiver and see if they need insulin pen needles. I started him on toujeo basal insulin but did not order pen needles initially. We have some extras that might work that they can pick up at the office. I also ordered pen needles and sent the prescription to the pharmacy.

## 2021-08-28 ENCOUNTER — Ambulatory Visit: Payer: Medicare Other | Admitting: Nurse Practitioner

## 2021-08-28 ENCOUNTER — Telehealth: Payer: Self-pay

## 2021-08-28 NOTE — Telephone Encounter (Signed)
Caregiver, Leanne Chang, will call back to r/s today's appointment due to office has no water-Toni

## 2021-09-11 ENCOUNTER — Ambulatory Visit: Payer: Medicare Other | Admitting: Internal Medicine

## 2021-09-18 ENCOUNTER — Other Ambulatory Visit: Payer: Self-pay

## 2021-09-18 ENCOUNTER — Ambulatory Visit (INDEPENDENT_AMBULATORY_CARE_PROVIDER_SITE_OTHER): Payer: Medicare Other | Admitting: Nurse Practitioner

## 2021-09-18 ENCOUNTER — Encounter: Payer: Self-pay | Admitting: Nurse Practitioner

## 2021-09-18 VITALS — BP 134/80 | HR 97 | Temp 98.2°F | Resp 16 | Ht 64.0 in | Wt 227.8 lb

## 2021-09-18 DIAGNOSIS — R051 Acute cough: Secondary | ICD-10-CM | POA: Diagnosis not present

## 2021-09-18 DIAGNOSIS — I10 Essential (primary) hypertension: Secondary | ICD-10-CM

## 2021-09-18 DIAGNOSIS — E1165 Type 2 diabetes mellitus with hyperglycemia: Secondary | ICD-10-CM | POA: Diagnosis not present

## 2021-09-18 MED ORDER — DAPAGLIFLOZIN PROPANEDIOL 10 MG PO TABS: 10.0000 mg | ORAL_TABLET | Freq: Every day | ORAL | 2 refills | Status: DC

## 2021-09-18 NOTE — Progress Notes (Signed)
[facility] [address] [place], [place] [address]  Internal MEDICINE  Office Visit Note  Patient Name: Brandon Knight  [id number]  [id number]  Date of Service: 09/18/2021  Chief Complaint  Patient presents with   Follow-up    Discuss meds   Diabetes   Gastroesophageal Reflux   Hypertension   Hyperlipidemia   Anxiety    HPI Brandon Knight presents for a follow up visit for diabetes with his caregiver. At his previous office visit, his A1C was 9.6 and he was started on basal insulin with toujeo at 10 units daily. He was already taking metformin as well. His AM glucose has still been over 200 most days per his caregiver. Further medication adjustment is needed to control glucose.     Current Medication: Outpatient Encounter Medications as of 09/18/2021  Medication Sig   benzonatate (TESSALON) 100 MG capsule Take 1 capsule (100 mg total) by mouth 2 (two) times daily as needed for cough.   Calcium Carb-Cholecalciferol 500-600 MG-UNIT TABS Take 1 tablet by mouth.   chlorhexidine (PERIDEX) 0.12 % solution Use as directed 15 mLs in the mouth or throat 2 (two) times daily.   cyanocobalamin (,VITAMIN B-12,) 1000 MCG/ML injection Inject 1 mL (1,000 mcg total) into the muscle every 30 (thirty) days.   dapagliflozin propanediol (FARXIGA) 10 MG TABS tablet Take 1 tablet (10 mg total) by mouth daily before breakfast.   folic acid (FOLVITE) 1 MG tablet Take 1 mg by mouth daily.   gemfibrozil (LOPID) 600 MG tablet Take 600 mg by mouth 2 (two) times daily before a meal.   glucose blood (ACCU-CHEK AVIVA PLUS) test strip USE TO CHECK BLOOD SUGAR DAILY AS DIRECTED. Dx e11.65   insulin glargine, 2 Unit Dial, (TOUJEO MAX) 300 UNIT/ML Solostar Pen Inject 10 Units into the skin daily before breakfast.   Insulin Pen Needle 32G X 6 MM MISC 1 Device by Does not apply route daily.   loratadine (CLARITIN) 10 MG tablet Take 10 mg by mouth daily.   LORazepam (ATIVAN) 0.5 MG tablet Take 0.5 mg by mouth  2 (two) times daily. Take 1 tab po BID prn   metFORMIN (GLUCOPHAGE) 1000 MG tablet Take 1 tablet (1,000 mg total) by mouth 2 (two) times daily with a meal.   metoprolol tartrate (LOPRESSOR) 25 MG tablet TAKE 1 TABLET BY MOUTH TWICE A DAY   naproxen (EC NAPROSYN) 500 MG EC tablet Take 500 mg by mouth 2 (two) times daily with a meal.   omeprazole (PRILOSEC) 40 MG capsule Take 1 capsule (40 mg total) by mouth daily.   PHENobarbital (LUMINAL) 32.4 MG tablet Take 4 tab QHS   phenytoin (DILANTIN) 100 MG ER capsule Take 4 capsules (400 mg total) by mouth at bedtime.   polyethylene glycol (MIRALAX / GLYCOLAX) packet Take 17 g by mouth daily.   sertraline (ZOLOFT) 50 MG tablet TAKE 1 TABLET BY MOUTH ONCE DAILY   thioridazine (MELLARIL) 25 MG tablet Take 25 mg by mouth daily. Take 1 tab po am given by psych   thioridazine (MELLARIL) 50 MG tablet Take 50 mg by mouth at bedtime. Take 1 tab qhs by psych   [DISCONTINUED] sertraline (ZOLOFT) 25 MG tablet Take 25 mg by mouth daily. (Patient not taking: Reported on 09/18/2021)   No facility-administered encounter medications on file as of 09/18/2021.    Surgical History: Past Surgical History:  Procedure Laterality Date   NO PAST SURGERIES      Medical History: Past Medical History:  Diagnosis Date   Anxiety    Diabetes mellitus without complication (HCC)    GERD (gastroesophageal reflux disease)    Hyperlipidemia    Hypertension    Seizures (HCC)     Family History: Family History  Family history unknown: Yes    Social History   Socioeconomic History   Marital status: Single    Spouse name: Not on file   Number of children: Not on file   Years of education: Not on file   Highest education level: Not on file  Occupational History   Not on file  Tobacco Use   Smoking status: Never   Smokeless tobacco: Never  Vaping Use   Vaping Use: Never used  Substance and Sexual Activity   Alcohol use: No   Drug use: No   Sexual activity: Not  on file  Other Topics Concern   Not on file  Social History Narrative   Not on file   Social Determinants of Health   Financial Resource Strain: Not on file  Food Insecurity: Not on file  Transportation Needs: Not on file  Physical Activity: Not on file  Stress: Not on file  Social Connections: Not on file  Intimate Partner Violence: Not on file      Review of Systems  Constitutional:  Negative for chills, fatigue and unexpected weight change.  HENT:  Negative for congestion, rhinorrhea, sneezing and sore throat.   Eyes:  Negative for redness.  Respiratory:  Negative for cough, chest tightness and shortness of breath.   Cardiovascular:  Negative for chest pain and palpitations.  Gastrointestinal:  Negative for abdominal pain, constipation, diarrhea, nausea and vomiting.  Genitourinary:  Negative for dysuria and frequency.  Musculoskeletal:  Negative for arthralgias, back pain, joint swelling and neck pain.  Skin:  Negative for rash.  Neurological: Negative.  Negative for tremors and numbness.  Hematological:  Negative for adenopathy. Does not bruise/bleed easily.  Psychiatric/Behavioral:  Negative for behavioral problems (Depression), sleep disturbance and suicidal ideas. The patient is not nervous/anxious.    Vital Signs: BP 134/80    Pulse 97    Temp 98.2 F (36.8 C)    Resp 16    Ht 5\' 4"  (1.626 m)    Wt 227 lb 12.8 oz (103.3 kg)    SpO2 96%    BMI 39.10 kg/m    Physical Exam Vitals reviewed.  Constitutional:      General: He is not in acute distress.    Appearance: Normal appearance. He is obese. He is not ill-appearing.  HENT:     Head: Normocephalic and atraumatic.     Right Ear: Tympanic membrane, ear canal and external ear normal.     Left Ear: Tympanic membrane, ear canal and external ear normal.     Nose: Nose normal. No congestion or rhinorrhea.     Mouth/Throat:     Mouth: Mucous membranes are moist.     Pharynx: Oropharynx is clear. No oropharyngeal  exudate or posterior oropharyngeal erythema.  Eyes:     Pupils: Pupils are equal, round, and reactive to light.  Cardiovascular:     Rate and Rhythm: Normal rate and regular rhythm.     Pulses: Normal pulses.     Heart sounds: Normal heart sounds. No murmur heard. Pulmonary:     Effort: Pulmonary effort is normal. No respiratory distress.     Breath sounds: Normal breath sounds. No wheezing.  Neurological:     Mental Status: He is alert and oriented  to person, place, and time.     Cranial Nerves: No cranial nerve deficit.     Coordination: Coordination normal.     Gait: Gait normal.  Psychiatric:        Mood and Affect: Mood normal.        Behavior: Behavior normal.       Assessment/Plan: 1. Uncontrolled type 2 diabetes mellitus with hyperglycemia (HCC) Continue current medications, add farxiga 10 mg daily. Follow up in 2 months for repat a1c.  - dapagliflozin propanediol (FARXIGA) 10 MG TABS tablet; Take 1 tablet (10 mg total) by mouth daily before breakfast.  Dispense: 30 tablet; Refill: 2  2. Essential hypertension Remained well controlled with current medication  3. Acute cough Resolved.    General Counseling: Eliav verbalizes understanding of the findings of todays visit and agrees with plan of treatment. I have discussed any further diagnostic evaluation that may be needed or ordered today. We also reviewed his medications today. he has been encouraged to call the office with any questions or concerns that should arise related to todays visit.    No orders of the defined types were placed in this encounter.   Meds ordered this encounter  Medications   dapagliflozin propanediol (FARXIGA) 10 MG TABS tablet    Sig: Take 1 tablet (10 mg total) by mouth daily before breakfast.    Dispense:  30 tablet    Refill:  2    Return in about 2 months (around 11/16/2021) for F/U, Recheck A1C, Adilene Areola PCP.   Total time spent:30 Minutes Time spent includes review of chart,  medications, test results, and follow up plan with the patient.   [place] Controlled Substance Database was reviewed by me.  This patient was seen by Sallyanne Kuster, FNP-C in collaboration with Dr. Beverely Risen as a part of collaborative care agreement.   Hattie Aguinaldo R. Tedd Sias, MSN, FNP-C Internal medicine

## 2021-10-03 ENCOUNTER — Other Ambulatory Visit: Payer: Self-pay | Admitting: Nurse Practitioner

## 2021-10-03 DIAGNOSIS — Z8669 Personal history of other diseases of the nervous system and sense organs: Secondary | ICD-10-CM

## 2021-10-03 MED ORDER — PHENOBARBITAL 32.4 MG PO TABS: ORAL_TABLET | ORAL | 3 refills | Status: DC

## 2021-11-13 ENCOUNTER — Ambulatory Visit: Payer: Medicare Other | Admitting: Nurse Practitioner

## 2021-11-28 ENCOUNTER — Ambulatory Visit (INDEPENDENT_AMBULATORY_CARE_PROVIDER_SITE_OTHER): Payer: Medicare Other | Admitting: Internal Medicine

## 2021-11-28 ENCOUNTER — Encounter: Payer: Self-pay | Admitting: Internal Medicine

## 2021-11-28 ENCOUNTER — Other Ambulatory Visit: Payer: Self-pay

## 2021-11-28 VITALS — BP 117/90 | HR 91 | Temp 98.3°F | Resp 16 | Ht 64.0 in | Wt 217.0 lb

## 2021-11-28 DIAGNOSIS — E785 Hyperlipidemia, unspecified: Secondary | ICD-10-CM

## 2021-11-28 DIAGNOSIS — G40909 Epilepsy, unspecified, not intractable, without status epilepticus: Secondary | ICD-10-CM | POA: Diagnosis not present

## 2021-11-28 DIAGNOSIS — E1169 Type 2 diabetes mellitus with other specified complication: Secondary | ICD-10-CM

## 2021-11-28 DIAGNOSIS — I10 Essential (primary) hypertension: Secondary | ICD-10-CM | POA: Diagnosis not present

## 2021-11-28 DIAGNOSIS — E1165 Type 2 diabetes mellitus with hyperglycemia: Secondary | ICD-10-CM | POA: Diagnosis not present

## 2021-11-28 LAB — POCT GLYCOSYLATED HEMOGLOBIN (HGB A1C): Hemoglobin A1C: 7.4 % — AB (ref 4.0–5.6)

## 2021-11-28 MED ORDER — EZETIMIBE-SIMVASTATIN 10-20 MG PO TABS: 1.0000 | ORAL_TABLET | Freq: Every day | ORAL | 3 refills | Status: DC

## 2021-11-28 NOTE — Progress Notes (Signed)
[facility] ?[address] ?[place], [place] [address] ? ?Internal MEDICINE  ?Office Visit Note ? ?Patient Name: LIZBETH WODRICH ? [id number]  ?[id number] ? ?Date of Service: 12/04/2021 ? ?Chief Complaint  ?Patient presents with  ? Follow-up  ? Diabetes  ? Hyperlipidemia  ? Hypertension  ? Gastroesophageal Reflux  ? ? ?HPI ?Patient is here for routine follow-up ?He lives in [facility] for many years due to mental disabilities and special needs  ?He denies any major complaints in the room with caregiver he gets really anxious that is the reason he has not had any lab work done ?Previously his triglyceride has been elevated and is on gemfibrozil twice a day. ?Wilder Glade was added on previous visit, patient is also on Toujeo and metformin. ?She has a disorder is under control with phenobarbital. ?Patient is followed by psych as well ? ?Current Medication: ?Outpatient Encounter Medications as of 11/28/2021  ?Medication Sig  ? benzonatate (TESSALON) 100 MG capsule Take 1 capsule (100 mg total) by mouth 2 (two) times daily as needed for cough.  ? Calcium Carb-Cholecalciferol 500-600 MG-UNIT TABS Take 1 tablet by mouth.  ? chlorhexidine (PERIDEX) 0.12 % solution Use as directed 15 mLs in the mouth or throat 2 (two) times daily.  ? cyanocobalamin (,VITAMIN B-12,) 1000 MCG/ML injection Inject 1 mL (1,000 mcg total) into the muscle every 30 (thirty) days.  ? dapagliflozin propanediol (FARXIGA) 10 MG TABS tablet Take 1 tablet (10 mg total) by mouth daily before breakfast.  ? ezetimibe-simvastatin (VYTORIN) 10-20 MG tablet Take 1 tablet by mouth at bedtime.  ? folic acid (FOLVITE) 1 MG tablet Take 1 mg by mouth daily.  ? glucose blood (ACCU-CHEK AVIVA PLUS) test strip USE TO CHECK BLOOD SUGAR DAILY AS DIRECTED. Dx e11.65  ? insulin glargine, 2 Unit Dial, (TOUJEO MAX) 300 UNIT/ML Solostar Pen Inject 10 Units into the skin daily before breakfast.  ? Insulin Pen Needle 32G X 6 MM MISC 1 Device by Does not apply route  daily.  ? loratadine (CLARITIN) 10 MG tablet Take 10 mg by mouth daily.  ? LORazepam (ATIVAN) 0.5 MG tablet Take 0.5 mg by mouth 2 (two) times daily. Take 1 tab po BID prn  ? metFORMIN (GLUCOPHAGE) 1000 MG tablet Take 1 tablet (1,000 mg total) by mouth 2 (two) times daily with a meal.  ? metoprolol tartrate (LOPRESSOR) 25 MG tablet TAKE 1 TABLET BY MOUTH TWICE A DAY  ? naproxen (EC NAPROSYN) 500 MG EC tablet Take 500 mg by mouth 2 (two) times daily with a meal.  ? omeprazole (PRILOSEC) 40 MG capsule Take 1 capsule (40 mg total) by mouth daily.  ? PHENobarbital (LUMINAL) 32.4 MG tablet Take 4 tab QHS  ? phenytoin (DILANTIN) 100 MG ER capsule Take 4 capsules (400 mg total) by mouth at bedtime.  ? polyethylene glycol (MIRALAX / GLYCOLAX) packet Take 17 g by mouth daily.  ? sertraline (ZOLOFT) 50 MG tablet TAKE 1 TABLET BY MOUTH ONCE DAILY  ? thioridazine (MELLARIL) 25 MG tablet Take 25 mg by mouth daily. Take 1 tab po am given by psych  ? thioridazine (MELLARIL) 50 MG tablet Take 50 mg by mouth at bedtime. Take 1 tab qhs by psych  ? [DISCONTINUED] gemfibrozil (LOPID) 600 MG tablet Take 600 mg by mouth 2 (two) times daily before a meal.  ? ?No facility-administered encounter medications on file as of 11/28/2021.  ? ? ?Surgical History: ?Past Surgical History:  ?Procedure Laterality Date  ? NO  PAST SURGERIES    ? ? ?Medical History: ?Past Medical History:  ?Diagnosis Date  ? Anxiety   ? Diabetes mellitus without complication ([place])   ? GERD (gastroesophageal reflux disease)   ? Hyperlipidemia   ? Hypertension   ? Seizures ([place])   ? ? ?Family History: ?Family History  ?Family history unknown: Yes  ? ? ?Social History  ? ?Socioeconomic History  ? Marital status: Single  ?  Spouse name: Not on file  ? Number of children: Not on file  ? Years of education: Not on file  ? Highest education level: Not on file  ?Occupational History  ? Not on file  ?Tobacco Use  ? Smoking status: Never  ? Smokeless tobacco: Never  ?Vaping Use  ?  Vaping Use: Never used  ?Substance and Sexual Activity  ? Alcohol use: No  ? Drug use: No  ? Sexual activity: Not on file  ?Other Topics Concern  ? Not on file  ?Social History Narrative  ? Not on file  ? ?Social Determinants of Health  ? ?Financial Resource Strain: Not on file  ?Food Insecurity: Not on file  ?Transportation Needs: Not on file  ?Physical Activity: Not on file  ?Stress: Not on file  ?Social Connections: Not on file  ?Intimate Partner Violence: Not on file  ? ? ? ? ?Review of Systems  ?Constitutional:  Negative for fatigue and fever.  ?HENT:  Negative for congestion, mouth sores and postnasal drip.   ?Respiratory:  Negative for cough.   ?Cardiovascular:  Negative for chest pain.  ?Genitourinary:  Negative for flank pain.  ?Psychiatric/Behavioral: Negative.    ? ?Vital Signs: ?BP 117/90   Pulse 91   Temp 98.3 ?F (36.8 ?C)   Resp 16   Ht 5\' 4"  (1.626 m)   Wt 217 lb (98.4 kg)   SpO2 97%   BMI 37.25 kg/m?  ? ? ?Physical Exam ?Constitutional:   ?   Appearance: Normal appearance.  ?HENT:  ?   Head: Normocephalic and atraumatic.  ?   Nose: Nose normal.  ?   Mouth/Throat:  ?   Mouth: Mucous membranes are moist.  ?   Pharynx: No posterior oropharyngeal erythema.  ?Eyes:  ?   Extraocular Movements: Extraocular movements intact.  ?   Pupils: Pupils are equal, round, and reactive to light.  ?Cardiovascular:  ?   Pulses: Normal pulses.  ?   Heart sounds: Normal heart sounds.  ?Pulmonary:  ?   Effort: Pulmonary effort is normal.  ?   Breath sounds: Normal breath sounds.  ?Neurological:  ?   General: No focal deficit present.  ?   Mental Status: He is alert.  ?Psychiatric:     ?   Mood and Affect: Mood normal.  ?   Comments: Patient is unable to communicate mostly  yes and no answers   ? ? ? ? ? ?Assessment/Plan: ?1. Uncontrolled type 2 diabetes mellitus with hyperglycemia ([place]) ?Improved diabetic  control ?Improved hemoglobin A1c is down from 9.6-7.4, will continue on Farxiga 10 mg once a day Toujeo and  metformin ?- POCT HgB A1C ? ?2. Essential hypertension ?Continue Lopressor as before ? ?3. Hyperlipidemia associated with type 2 diabetes mellitus ([place]) ?We will stop Lopid and start on Vytorin ?- ezetimibe-simvastatin (VYTORIN) 10-20 MG tablet; Take 1 tablet by mouth at bedtime.  Dispense: 90 tablet; Refill: 3 ? ?4. Seizure disorder ([place]) ?Continue all medications as before ? ?General Counseling: Huan verbalizes understanding of the findings of  todays visit and agrees with plan of treatment. I have discussed any further diagnostic evaluation that may be needed or ordered today. We also reviewed his medications today. he has been encouraged to call the office with any questions or concerns that should arise related to todays visit. ? ? ? ?Orders Placed This Encounter  ?Procedures  ? POCT HgB A1C  ? ? ?Meds ordered this encounter  ?Medications  ? ezetimibe-simvastatin (VYTORIN) 10-20 MG tablet  ?  Sig: Take 1 tablet by mouth at bedtime.  ?  Dispense:  90 tablet  ?  Refill:  3  ? ? ?Total time spent:30 Minutes ?Time spent includes review of chart, medications, test results, and follow up plan with the patient.  ? ?[place] Controlled Substance Database was reviewed by me. ? ? ?Dr Lavera Guise ?Internal medicine  ?

## 2021-12-06 ENCOUNTER — Other Ambulatory Visit: Payer: Self-pay

## 2021-12-06 DIAGNOSIS — E1165 Type 2 diabetes mellitus with hyperglycemia: Secondary | ICD-10-CM

## 2021-12-06 MED ORDER — INSULIN GLARGINE (2 UNIT DIAL) 300 UNIT/ML ~~LOC~~ SOPN: 10.0000 [IU] | PEN_INJECTOR | Freq: Every day | SUBCUTANEOUS | 3 refills | Status: DC

## 2022-01-04 ENCOUNTER — Other Ambulatory Visit: Payer: Self-pay | Admitting: Nurse Practitioner

## 2022-01-04 DIAGNOSIS — Z8669 Personal history of other diseases of the nervous system and sense organs: Secondary | ICD-10-CM

## 2022-01-04 MED ORDER — PHENOBARBITAL 32.4 MG PO TABS: ORAL_TABLET | ORAL | 3 refills | Status: DC

## 2022-01-15 ENCOUNTER — Other Ambulatory Visit: Payer: Self-pay

## 2022-01-15 DIAGNOSIS — E1165 Type 2 diabetes mellitus with hyperglycemia: Secondary | ICD-10-CM

## 2022-01-15 DIAGNOSIS — E11649 Type 2 diabetes mellitus with hypoglycemia without coma: Secondary | ICD-10-CM

## 2022-01-15 MED ORDER — ACCU-CHEK AVIVA PLUS VI STRP: ORAL_STRIP | 3 refills | Status: DC

## 2022-01-15 MED ORDER — INSULIN PEN NEEDLE 32G X 6 MM MISC: 1.0000 | Freq: Every day | 3 refills | Status: DC

## 2022-01-23 ENCOUNTER — Other Ambulatory Visit: Payer: Self-pay | Admitting: Nurse Practitioner

## 2022-01-23 DIAGNOSIS — E1165 Type 2 diabetes mellitus with hyperglycemia: Secondary | ICD-10-CM

## 2022-01-24 ENCOUNTER — Other Ambulatory Visit: Payer: Self-pay

## 2022-01-24 DIAGNOSIS — Z8669 Personal history of other diseases of the nervous system and sense organs: Secondary | ICD-10-CM

## 2022-01-24 MED ORDER — OMEPRAZOLE 40 MG PO CPDR: 40.0000 mg | DELAYED_RELEASE_CAPSULE | Freq: Every day | ORAL | 5 refills | Status: DC

## 2022-01-24 MED ORDER — PHENYTOIN SODIUM EXTENDED 100 MG PO CAPS: 400.0000 mg | ORAL_CAPSULE | Freq: Every day | ORAL | 3 refills | Status: DC

## 2022-01-25 ENCOUNTER — Other Ambulatory Visit: Payer: Self-pay

## 2022-01-25 DIAGNOSIS — Z8669 Personal history of other diseases of the nervous system and sense organs: Secondary | ICD-10-CM

## 2022-01-25 DIAGNOSIS — E1165 Type 2 diabetes mellitus with hyperglycemia: Secondary | ICD-10-CM

## 2022-01-25 MED ORDER — OMEPRAZOLE 40 MG PO CPDR: 40.0000 mg | DELAYED_RELEASE_CAPSULE | Freq: Every day | ORAL | 5 refills | Status: DC

## 2022-01-25 MED ORDER — SERTRALINE HCL 50 MG PO TABS: 50.0000 mg | ORAL_TABLET | Freq: Every day | ORAL | 3 refills | Status: DC

## 2022-01-25 MED ORDER — METFORMIN HCL 1000 MG PO TABS: 1000.0000 mg | ORAL_TABLET | Freq: Two times a day (BID) | ORAL | 3 refills | Status: DC

## 2022-01-25 MED ORDER — PHENYTOIN SODIUM EXTENDED 100 MG PO CAPS: 400.0000 mg | ORAL_CAPSULE | Freq: Every day | ORAL | 3 refills | Status: DC

## 2022-02-01 ENCOUNTER — Other Ambulatory Visit: Payer: Self-pay

## 2022-02-01 DIAGNOSIS — E1169 Type 2 diabetes mellitus with other specified complication: Secondary | ICD-10-CM

## 2022-02-01 DIAGNOSIS — Z8669 Personal history of other diseases of the nervous system and sense organs: Secondary | ICD-10-CM

## 2022-02-01 DIAGNOSIS — E11649 Type 2 diabetes mellitus with hypoglycemia without coma: Secondary | ICD-10-CM

## 2022-02-01 DIAGNOSIS — E1165 Type 2 diabetes mellitus with hyperglycemia: Secondary | ICD-10-CM

## 2022-02-01 MED ORDER — EZETIMIBE-SIMVASTATIN 10-20 MG PO TABS: 1.0000 | ORAL_TABLET | Freq: Every day | ORAL | 1 refills | Status: DC

## 2022-02-01 MED ORDER — DAPAGLIFLOZIN PROPANEDIOL 10 MG PO TABS: ORAL_TABLET | ORAL | 1 refills | Status: DC

## 2022-02-01 MED ORDER — METFORMIN HCL 1000 MG PO TABS: 1000.0000 mg | ORAL_TABLET | Freq: Two times a day (BID) | ORAL | 1 refills | Status: DC

## 2022-02-01 MED ORDER — ACCU-CHEK AVIVA PLUS VI STRP: ORAL_STRIP | 3 refills | Status: DC

## 2022-02-01 MED ORDER — SERTRALINE HCL 50 MG PO TABS: 50.0000 mg | ORAL_TABLET | Freq: Every day | ORAL | 1 refills | Status: DC

## 2022-02-01 MED ORDER — CYANOCOBALAMIN 1000 MCG/ML IJ SOLN: 1000.0000 ug | INTRAMUSCULAR | 1 refills | Status: DC

## 2022-02-01 MED ORDER — METOPROLOL TARTRATE 25 MG PO TABS: 25.0000 mg | ORAL_TABLET | Freq: Two times a day (BID) | ORAL | 1 refills | Status: DC

## 2022-02-01 MED ORDER — INSULIN GLARGINE (2 UNIT DIAL) 300 UNIT/ML ~~LOC~~ SOPN: 10.0000 [IU] | PEN_INJECTOR | Freq: Every day | SUBCUTANEOUS | 1 refills | Status: DC

## 2022-02-01 MED ORDER — PHENYTOIN SODIUM EXTENDED 100 MG PO CAPS: 400.0000 mg | ORAL_CAPSULE | Freq: Every day | ORAL | 1 refills | Status: DC

## 2022-02-01 MED ORDER — OMEPRAZOLE 40 MG PO CPDR: 40.0000 mg | DELAYED_RELEASE_CAPSULE | Freq: Every day | ORAL | 1 refills | Status: DC

## 2022-02-01 MED ORDER — INSULIN PEN NEEDLE 32G X 6 MM MISC: 1.0000 | Freq: Every day | 1 refills | Status: DC

## 2022-02-06 ENCOUNTER — Telehealth: Payer: Self-pay

## 2022-02-06 NOTE — Telephone Encounter (Signed)
Refill authorization signed by Dr.Khan and caretaker has been advised ready for pickup.

## 2022-03-04 ENCOUNTER — Other Ambulatory Visit: Payer: Self-pay

## 2022-03-12 ENCOUNTER — Ambulatory Visit: Payer: Medicare Other | Admitting: Nurse Practitioner

## 2022-03-15 ENCOUNTER — Telehealth: Payer: Self-pay

## 2022-03-15 NOTE — Telephone Encounter (Signed)
Pt caregiver called that he had rash on stomach advised need appt as per lauren  they can try OTC  hydrocortisone if worse go to urgent care or keep appt for Monday

## 2022-03-18 ENCOUNTER — Ambulatory Visit (INDEPENDENT_AMBULATORY_CARE_PROVIDER_SITE_OTHER): Payer: Medicare Other | Admitting: Physician Assistant

## 2022-03-18 ENCOUNTER — Encounter: Payer: Self-pay | Admitting: Physician Assistant

## 2022-03-18 VITALS — BP 106/60 | HR 93 | Temp 98.4°F | Resp 16 | Ht 64.0 in | Wt 203.8 lb

## 2022-03-18 DIAGNOSIS — R21 Rash and other nonspecific skin eruption: Secondary | ICD-10-CM

## 2022-03-18 MED ORDER — NYSTATIN-TRIAMCINOLONE 100000-0.1 UNIT/GM-% EX OINT: 1.0000 | TOPICAL_OINTMENT | Freq: Two times a day (BID) | CUTANEOUS | 0 refills | Status: DC

## 2022-03-18 NOTE — Progress Notes (Signed)
[facility] [address] [place], [place] [address]  Internal MEDICINE  Office Visit Note  Patient Name: Brandon Knight  [id number]  [id number]  Date of Service: 03/20/2022  Chief Complaint  Patient presents with   Rash    On stomach - itches and burns     HPI Pt is here for a sick visit with caregiver -Pt is unable to verbalize exact symptoms or timeline -Per staff, Rash started a few days ago. Appears to be Itchy, but has not complained of pain -caregiver is uncertain if any creams have been used or if rash has changed in appearance at all -Rash is present along abdomen, but does not progress to back and shows signs of excoriations  Current Medication:  Outpatient Encounter Medications as of 03/18/2022  Medication Sig   Calcium Carb-Cholecalciferol 500-600 MG-UNIT TABS Take 1 tablet by mouth.   chlorhexidine (PERIDEX) 0.12 % solution Use as directed 15 mLs in the mouth or throat 2 (two) times daily.   cyanocobalamin (,VITAMIN B-12,) 1000 MCG/ML injection Inject 1 mL (1,000 mcg total) into the muscle every 30 (thirty) days.   dapagliflozin propanediol (FARXIGA) 10 MG TABS tablet TAKE 1 TABLET BY MOUTH BEFORE BREAKFAST   ezetimibe-simvastatin (VYTORIN) 10-20 MG tablet Take 1 tablet by mouth at bedtime.   folic acid (FOLVITE) 1 MG tablet Take 1 mg by mouth daily.   glucose blood (ACCU-CHEK AVIVA PLUS) test strip USE TO CHECK BLOOD SUGAR DAILY AS DIRECTED. Dx e11.65   insulin glargine, 2 Unit Dial, (TOUJEO MAX) 300 UNIT/ML Solostar Pen Inject 10 Units into the skin daily before breakfast.   Insulin Pen Needle 32G X 6 MM MISC 1 Device by Does not apply route daily.   loratadine (CLARITIN) 10 MG tablet Take 10 mg by mouth daily.   LORazepam (ATIVAN) 0.5 MG tablet Take 0.5 mg by mouth 2 (two) times daily. Take 1 tab po BID prn   metFORMIN (GLUCOPHAGE) 1000 MG tablet Take 1 tablet (1,000 mg total) by mouth 2 (two) times daily with a meal.   metoprolol tartrate  (LOPRESSOR) 25 MG tablet Take 1 tablet (25 mg total) by mouth 2 (two) times daily.   naproxen (EC NAPROSYN) 500 MG EC tablet Take 500 mg by mouth 2 (two) times daily with a meal.   nystatin-triamcinolone ointment (MYCOLOG) Apply 1 Application topically 2 (two) times daily.   omeprazole (PRILOSEC) 40 MG capsule Take 1 capsule (40 mg total) by mouth daily.   PHENobarbital (LUMINAL) 32.4 MG tablet Take 4 tab QHS   phenytoin (DILANTIN) 100 MG ER capsule Take 4 capsules (400 mg total) by mouth at bedtime.   polyethylene glycol (MIRALAX / GLYCOLAX) packet Take 17 g by mouth daily.   sertraline (ZOLOFT) 50 MG tablet Take 1 tablet (50 mg total) by mouth daily.   thioridazine (MELLARIL) 25 MG tablet Take 25 mg by mouth daily. Take 1 tab po am given by psych   thioridazine (MELLARIL) 50 MG tablet Take 50 mg by mouth at bedtime. Take 1 tab qhs by psych   No facility-administered encounter medications on file as of 03/18/2022.      Medical History: Past Medical History:  Diagnosis Date   Anxiety    Diabetes mellitus without complication (HCC)    GERD (gastroesophageal reflux disease)    Hyperlipidemia    Hypertension    Seizures (HCC)      Vital Signs: BP 106/60   Pulse 93   Temp 98.4 F (36.9 C)  Resp 16   Ht 5\' 4"  (1.626 m)   Wt 203 lb 12.8 oz (92.4 kg)   SpO2 93%   BMI 34.98 kg/m    Review of Systems  Constitutional:  Negative for fatigue and fever.  HENT:  Negative for congestion, mouth sores and postnasal drip.   Respiratory:  Negative for cough.   Cardiovascular:  Negative for chest pain.  Genitourinary:  Negative for flank pain.  Skin:  Positive for rash.  Psychiatric/Behavioral: Negative.      Physical Exam Vitals and nursing note reviewed.  Constitutional:      Appearance: Normal appearance. He is obese.  HENT:     Head: Normocephalic and atraumatic.     Nose: Nose normal.     Mouth/Throat:     Mouth: Mucous membranes are moist.     Pharynx: No posterior  oropharyngeal erythema.  Eyes:     Extraocular Movements: Extraocular movements intact.     Pupils: Pupils are equal, round, and reactive to light.  Cardiovascular:     Pulses: Normal pulses.     Heart sounds: Normal heart sounds.  Pulmonary:     Effort: Pulmonary effort is normal.     Breath sounds: Normal breath sounds.  Skin:    Findings: Rash present. Rash is not vesicular.          Comments: Rash present across entire upper abdomen. Excoriations present. No vesicles visualized.  Neurological:     General: No focal deficit present.     Mental Status: He is alert.  Psychiatric:        Mood and Affect: Mood normal.     Comments: Patient is unable to communicate mostly  yes and no answers        Assessment/Plan: 1. Rash Will start on mycolog ointment to help with rash and advised to avoid scratching. Keep area clean and dry. Advised caregiver to call if not improving or if worsening. - nystatin-triamcinolone ointment (MYCOLOG); Apply 1 Application topically 2 (two) times daily.  Dispense: 30 g; Refill: 0   General Counseling: Brandon Knight verbalizes understanding of the findings of todays visit and agrees with plan of treatment. I have discussed any further diagnostic evaluation that may be needed or ordered today. We also reviewed his medications today. he has been encouraged to call the office with any questions or concerns that should arise related to todays visit.    Counseling:    No orders of the defined types were placed in this encounter.   Meds ordered this encounter  Medications   nystatin-triamcinolone ointment (MYCOLOG)    Sig: Apply 1 Application topically 2 (two) times daily.    Dispense:  30 g    Refill:  0    Time spent:30 Minutes

## 2022-03-26 ENCOUNTER — Telehealth: Payer: Self-pay

## 2022-03-26 NOTE — Telephone Encounter (Signed)
Wife will call back to either confirm 04/02/22 appointment or r/s-Toni

## 2022-04-02 ENCOUNTER — Encounter: Payer: Self-pay | Admitting: Nurse Practitioner

## 2022-04-02 ENCOUNTER — Ambulatory Visit (INDEPENDENT_AMBULATORY_CARE_PROVIDER_SITE_OTHER): Payer: Medicare Other | Admitting: Nurse Practitioner

## 2022-04-02 VITALS — BP 113/75 | HR 100 | Temp 98.7°F | Resp 16 | Ht 64.0 in | Wt 203.0 lb

## 2022-04-02 DIAGNOSIS — Z1212 Encounter for screening for malignant neoplasm of rectum: Secondary | ICD-10-CM

## 2022-04-02 DIAGNOSIS — Z1211 Encounter for screening for malignant neoplasm of colon: Secondary | ICD-10-CM

## 2022-04-02 DIAGNOSIS — E559 Vitamin D deficiency, unspecified: Secondary | ICD-10-CM | POA: Diagnosis not present

## 2022-04-02 DIAGNOSIS — R300 Dysuria: Secondary | ICD-10-CM

## 2022-04-02 DIAGNOSIS — E1165 Type 2 diabetes mellitus with hyperglycemia: Secondary | ICD-10-CM | POA: Diagnosis not present

## 2022-04-02 DIAGNOSIS — I10 Essential (primary) hypertension: Secondary | ICD-10-CM

## 2022-04-02 DIAGNOSIS — F79 Unspecified intellectual disabilities: Secondary | ICD-10-CM

## 2022-04-02 DIAGNOSIS — E538 Deficiency of other specified B group vitamins: Secondary | ICD-10-CM | POA: Diagnosis not present

## 2022-04-02 DIAGNOSIS — Z125 Encounter for screening for malignant neoplasm of prostate: Secondary | ICD-10-CM

## 2022-04-02 DIAGNOSIS — Z5181 Encounter for therapeutic drug level monitoring: Secondary | ICD-10-CM

## 2022-04-02 DIAGNOSIS — G4733 Obstructive sleep apnea (adult) (pediatric): Secondary | ICD-10-CM

## 2022-04-02 DIAGNOSIS — Z0001 Encounter for general adult medical examination with abnormal findings: Secondary | ICD-10-CM

## 2022-04-02 DIAGNOSIS — E1169 Type 2 diabetes mellitus with other specified complication: Secondary | ICD-10-CM

## 2022-04-02 DIAGNOSIS — Z760 Encounter for issue of repeat prescription: Secondary | ICD-10-CM

## 2022-04-02 DIAGNOSIS — Z8669 Personal history of other diseases of the nervous system and sense organs: Secondary | ICD-10-CM

## 2022-04-02 DIAGNOSIS — R6889 Other general symptoms and signs: Secondary | ICD-10-CM

## 2022-04-02 DIAGNOSIS — E785 Hyperlipidemia, unspecified: Secondary | ICD-10-CM

## 2022-04-02 DIAGNOSIS — G40909 Epilepsy, unspecified, not intractable, without status epilepticus: Secondary | ICD-10-CM

## 2022-04-02 LAB — POCT GLYCOSYLATED HEMOGLOBIN (HGB A1C): Hemoglobin A1C: 6.9 % — AB (ref 4.0–5.6)

## 2022-04-02 MED ORDER — INSULIN GLARGINE (2 UNIT DIAL) 300 UNIT/ML ~~LOC~~ SOPN: 10.0000 [IU] | PEN_INJECTOR | Freq: Every day | SUBCUTANEOUS | 1 refills | Status: DC

## 2022-04-02 MED ORDER — PHENYTOIN SODIUM EXTENDED 100 MG PO CAPS: 400.0000 mg | ORAL_CAPSULE | Freq: Every day | ORAL | 1 refills | Status: DC

## 2022-04-02 MED ORDER — METFORMIN HCL 1000 MG PO TABS: 1000.0000 mg | ORAL_TABLET | Freq: Two times a day (BID) | ORAL | 1 refills | Status: DC

## 2022-04-02 MED ORDER — SERTRALINE HCL 50 MG PO TABS: 50.0000 mg | ORAL_TABLET | Freq: Every day | ORAL | 1 refills | Status: DC

## 2022-04-02 MED ORDER — OMEPRAZOLE 40 MG PO CPDR: 40.0000 mg | DELAYED_RELEASE_CAPSULE | Freq: Every day | ORAL | 1 refills | Status: DC

## 2022-04-02 MED ORDER — DAPAGLIFLOZIN PROPANEDIOL 10 MG PO TABS: ORAL_TABLET | ORAL | 1 refills | Status: DC

## 2022-04-02 MED ORDER — METOPROLOL TARTRATE 25 MG PO TABS: 25.0000 mg | ORAL_TABLET | Freq: Two times a day (BID) | ORAL | 1 refills | Status: DC

## 2022-04-02 MED ORDER — PHENOBARBITAL 32.4 MG PO TABS: ORAL_TABLET | ORAL | 3 refills | Status: DC

## 2022-04-02 MED ORDER — INSULIN PEN NEEDLE 32G X 6 MM MISC: 1.0000 | Freq: Every day | 1 refills | Status: DC

## 2022-04-02 NOTE — Progress Notes (Signed)
[facility] [place],  [id number]  Internal MEDICINE  Office Visit Note  Patient Name: Brandon Knight  [id number]  [id number]  Date of Service: 04/02/2022  Chief Complaint  Patient presents with   Medicare Wellness   Diabetes   Gastroesophageal Reflux   Hyperlipidemia   Hypertension   Anxiety    HPI Brandon Knight presents for an annual well visit and physical exam.  He is a well-appearing 56 year old male with hypertension, hyperlipidemia, diabetes, generalized anxiety disorder, seizure disorder, and an intellectual disability. Brandon Knight presents today in good spirits, he is smiling, pleasant and has actually spoken in a few whole sentences.  Today is a good day for him and he has also agreed to have his labs drawn today in the office.  Last year at his annual physical his labs were ordered but he never had them drawn and his staff from the facility he stays at had mentioned that he does not like to have his labs drawn. He has been taking phenobarbital for a long time to prevent him from having a seizure and we do not have any recent phenobarbital blood levels to ensure that he is within therapeutic range. He is also overdue to get all other routine labs drawn. His A1c is 6.9 which is improved from 7.4 in April this year and he continues to take [place] 10 mg daily, Toujeo 10 units daily and metformin 1000 mg twice daily with meals He needs refills of most of his medications which will be sent today. His blood pressure is within normal limits, his other vital signs are also stable and within normal limits. Venipuncture of left hand was performed by myself, the provider, Ferman Basilio FNP-C and all ordered labs were drawn. Patient was asked if his labs could be drawn today prior to attempting and he allowed the venipuncture to be done and labs to be drawn without incident or any negative change in behavior.     Current Medication: Outpatient Encounter  Medications as of 04/02/2022  Medication Sig   Calcium Carb-Cholecalciferol 500-600 MG-UNIT TABS Take 1 tablet by mouth.   chlorhexidine (PERIDEX) 0.12 % solution Use as directed 15 mLs in the mouth or throat 2 (two) times daily.   ezetimibe-simvastatin (VYTORIN) 10-20 MG tablet Take 1 tablet by mouth at bedtime.   folic acid (FOLVITE) 1 MG tablet Take 1 mg by mouth daily.   glucose blood (ACCU-CHEK AVIVA PLUS) test strip USE TO CHECK BLOOD SUGAR DAILY AS DIRECTED. Dx e11.65   loratadine (CLARITIN) 10 MG tablet Take 10 mg by mouth daily.   LORazepam (ATIVAN) 0.5 MG tablet Take 0.5 mg by mouth 2 (two) times daily. Take 1 tab po BID prn   naproxen (EC NAPROSYN) 500 MG EC tablet Take 500 mg by mouth 2 (two) times daily with a meal.   nystatin-triamcinolone ointment (MYCOLOG) Apply 1 Application topically 2 (two) times daily.   polyethylene glycol (MIRALAX / GLYCOLAX) packet Take 17 g by mouth daily.   thioridazine (MELLARIL) 25 MG tablet Take 25 mg by mouth daily. Take 1 tab po am given by psych   thioridazine (MELLARIL) 50 MG tablet Take 50 mg by mouth at bedtime. Take 1 tab qhs by psych   [DISCONTINUED] cyanocobalamin (,VITAMIN B-12,) 1000 MCG/ML injection Inject 1 mL (1,000 mcg total) into the muscle every 30 (thirty) days.   [DISCONTINUED] dapagliflozin propanediol (FARXIGA) 10 MG TABS tablet TAKE 1 TABLET BY MOUTH BEFORE BREAKFAST   [DISCONTINUED] insulin glargine, 2  Unit Dial, (TOUJEO MAX) 300 UNIT/ML Solostar Pen Inject 10 Units into the skin daily before breakfast.   [DISCONTINUED] Insulin Pen Needle 32G X 6 MM MISC 1 Device by Does not apply route daily.   [DISCONTINUED] metFORMIN (GLUCOPHAGE) 1000 MG tablet Take 1 tablet (1,000 mg total) by mouth 2 (two) times daily with a meal.   [DISCONTINUED] metoprolol tartrate (LOPRESSOR) 25 MG tablet Take 1 tablet (25 mg total) by mouth 2 (two) times daily.   [DISCONTINUED] omeprazole (PRILOSEC) 40 MG capsule Take 1 capsule (40 mg total) by mouth  daily.   [DISCONTINUED] PHENobarbital (LUMINAL) 32.4 MG tablet Take 4 tab QHS   [DISCONTINUED] phenytoin (DILANTIN) 100 MG ER capsule Take 4 capsules (400 mg total) by mouth at bedtime.   [DISCONTINUED] sertraline (ZOLOFT) 50 MG tablet Take 1 tablet (50 mg total) by mouth daily.   dapagliflozin propanediol (FARXIGA) 10 MG TABS tablet TAKE 1 TABLET BY MOUTH BEFORE BREAKFAST   insulin glargine, 2 Unit Dial, (TOUJEO MAX) 300 UNIT/ML Solostar Pen Inject 10 Units into the skin daily before breakfast.   Insulin Pen Needle 32G X 6 MM MISC 1 Device by Does not apply route daily.   metFORMIN (GLUCOPHAGE) 1000 MG tablet Take 1 tablet (1,000 mg total) by mouth 2 (two) times daily with a meal.   metoprolol tartrate (LOPRESSOR) 25 MG tablet Take 1 tablet (25 mg total) by mouth 2 (two) times daily.   omeprazole (PRILOSEC) 40 MG capsule Take 1 capsule (40 mg total) by mouth daily.   PHENobarbital (LUMINAL) 32.4 MG tablet Take 4 tab QHS   phenytoin (DILANTIN) 100 MG ER capsule Take 4 capsules (400 mg total) by mouth at bedtime.   sertraline (ZOLOFT) 50 MG tablet Take 1 tablet (50 mg total) by mouth daily.   No facility-administered encounter medications on file as of 04/02/2022.    Surgical History: Past Surgical History:  Procedure Laterality Date   NO PAST SURGERIES      Medical History: Past Medical History:  Diagnosis Date   Anxiety    Diabetes mellitus without complication (HCC)    GERD (gastroesophageal reflux disease)    Hyperlipidemia    Hypertension    Seizures ([place])     Family History: Family History  Family history unknown: Yes    Social History   Socioeconomic History   Marital status: Single    Spouse name: Not on file   Number of children: Not on file   Years of education: Not on file   Highest education level: Not on file  Occupational History   Not on file  Tobacco Use   Smoking status: Never   Smokeless tobacco: Never  Vaping Use   Vaping Use: Never used   Substance and Sexual Activity   Alcohol use: No   Drug use: No   Sexual activity: Not on file  Other Topics Concern   Not on file  Social History Narrative   Not on file   Social Determinants of Health   Financial Resource Strain: Not on file  Food Insecurity: Not on file  Transportation Needs: Not on file  Physical Activity: Not on file  Stress: Not on file  Social Connections: Not on file  Intimate Partner Violence: Not on file      Review of Systems  Constitutional:  Negative for activity change, appetite change, chills, fatigue, fever and unexpected weight change.  HENT: Negative.  Negative for congestion, ear pain, rhinorrhea, sore throat and trouble swallowing.   Eyes: Negative.  Respiratory: Negative.  Negative for cough, chest tightness, shortness of breath and wheezing.   Cardiovascular: Negative.  Negative for chest pain.  Gastrointestinal: Negative.  Negative for abdominal pain, blood in stool, constipation, diarrhea, nausea and vomiting.  Endocrine: Negative.   Genitourinary: Negative.  Negative for difficulty urinating, dysuria, frequency, hematuria and urgency.  Musculoskeletal: Negative.  Negative for arthralgias, back pain, joint swelling, myalgias and neck pain.  Skin: Negative.  Negative for rash and wound.  Allergic/Immunologic: Negative.  Negative for immunocompromised state.  Neurological: Negative.  Negative for dizziness, seizures, numbness and headaches.  Hematological: Negative.   Psychiatric/Behavioral:  Positive for agitation, behavioral problems and dysphoric mood. Negative for self-injury and suicidal ideas. The patient is nervous/anxious.     Vital Signs: BP 113/75   Pulse 100   Temp 98.7 F (37.1 C)   Resp 16   Ht 5' 4" (1.626 m)   Wt 203 lb (92.1 kg)   SpO2 95%   BMI 34.84 kg/m    Physical Exam Vitals reviewed.  Constitutional:      General: He is not in acute distress.    Appearance: Normal appearance. He is well-developed.  He is obese. He is not ill-appearing or toxic-appearing.  HENT:     Head: Normocephalic and atraumatic.     Right Ear: Tympanic membrane, ear canal and external ear normal.     Left Ear: Tympanic membrane, ear canal and external ear normal.     Nose: Nose normal.     Mouth/Throat:     Mouth: Mucous membranes are moist.     Pharynx: Oropharynx is clear. No oropharyngeal exudate or posterior oropharyngeal erythema.  Eyes:     Extraocular Movements: Extraocular movements intact.     Conjunctiva/sclera: Conjunctivae normal.     Pupils: Pupils are equal, round, and reactive to light.  Neck:     Vascular: No carotid bruit.  Cardiovascular:     Rate and Rhythm: Normal rate and regular rhythm.     Pulses: Normal pulses.          Dorsalis pedis pulses are 2+ on the right side and 2+ on the left side.       Posterior tibial pulses are 2+ on the right side and 2+ on the left side.     Heart sounds: Normal heart sounds. No murmur heard.    No friction rub. No gallop.  Pulmonary:     Effort: Pulmonary effort is normal. No respiratory distress.     Breath sounds: Normal breath sounds. No wheezing.  Abdominal:     General: Bowel sounds are normal. There is no distension.     Palpations: Abdomen is soft.     Tenderness: There is no abdominal tenderness. There is no guarding.  Musculoskeletal:     Cervical back: Normal range of motion and neck supple.     Right foot: Normal range of motion. No deformity, bunion or foot drop.     Left foot: Normal range of motion. No deformity, bunion or foot drop.  Feet:     Right foot:     Protective Sensation: 6 sites tested.  6 sites sensed.     Skin integrity: Skin integrity normal. No ulcer, blister, skin breakdown, erythema, warmth, callus, dry skin or fissure.     Toenail Condition: Right toenails are abnormally thick.     Left foot:     Protective Sensation: 6 sites tested.  6 sites sensed.     Skin integrity: Skin integrity normal.  No ulcer, blister,  skin breakdown, erythema, warmth, callus, dry skin or fissure.     Toenail Condition: Left toenails are abnormally thick.  Lymphadenopathy:     Cervical: No cervical adenopathy.  Skin:    General: Skin is warm and dry.     Capillary Refill: Capillary refill takes less than 2 seconds.  Neurological:     Mental Status: He is alert. Mental status is at baseline.     Cranial Nerves: No facial asymmetry.     Gait: Gait is intact. Gait normal.     Comments: Nonverbal, unable to assess orientation  Psychiatric:        Attention and Perception: He is inattentive. He does not perceive auditory or visual hallucinations.        Mood and Affect: Mood is not anxious, depressed or elated. Affect is flat. Affect is not blunt.        Speech: He is noncommunicative (nonverbal, grunting and some mumbling). Speech is delayed.        Behavior: Behavior is slowed and withdrawn. Behavior is not agitated (not agitated today but can become easily agitated and aggressive at times per caregiver report.), hyperactive or combative. Behavior is cooperative.        Thought Content: Thought content is not delusional (unable to assess, patient only grunts, mumbles and sometimes answered yes or no.). Thought content does not include homicidal or suicidal ideation.        Cognition and Memory: Cognition is impaired.        Judgment: Judgment is impulsive (per caregiver report) and inappropriate (per caregiver report).        Assessment/Plan: 1. Encounter for general adult medical examination with abnormal findings Age-appropriate preventive screenings and vaccinations discussed, annual physical exam completed. Routine labs for health maintenance ordered and drawn by the provider during the office visit. PHM updated. PSA and cologuard ordered.  - POCT HgB A1C - Microalbumin, urine - CBC with Differential/Platelet - CMP14+EGFR - Lipid Profile - Vitamin D (25 hydroxy) - TSH + free T4 - Phenobarbital level - PSA  Total (Reflex To Free) - Cologuard - B12 and Folate Panel  2. Uncontrolled type 2 diabetes mellitus with hyperglycemia (HCC) A1c continues to improve, at 6.9 today. Additional labs ordered and drawn during office visit. Follow up in 3 months to repeat a1c - POCT HgB A1C - Microalbumin, urine - CBC with Differential/Platelet - CMP14+EGFR - Lipid Profile - Vitamin D (25 hydroxy) - TSH + free T4 - Phenobarbital level - B12 and Folate Panel  3. Hyperlipidemia associated with type 2 diabetes mellitus (HCC) - CMP14+EGFR - Lipid Profile - TSH + free T4  4. Vitamin D deficiency - Vitamin D (25 hydroxy)  5. B12 deficiency - B12 and Folate Panel  6. Other general symptoms and signs Venipuncture performed in office by the provider, Brandon Osgood FNP-C due to patient being comfortable with the provider and usually is very difficult to get him to go to the lab and allow the phlebotomist to drawn his blood.   7. Dysuria - UA/M w/rflx Culture, Routine - Microscopic Examination - Specimen status report  8. Intellectual disability Due to severe mental and intellectual disability, his labs were drawn by the provider during his office visit today. Per caregivers, it is difficult to get him to go to the lab for blood draw. - CBC with Differential/Platelet - CMP14+EGFR - Lipid Profile - Vitamin D (25 hydroxy) - TSH + free T4 - Phenobarbital level - PSA Total (  Reflex To Free) - Cologuard - B12 and Folate Panel  9. Screening for colorectal cancer - Cologuard  10. Screening for prostate cancer - PSA Total (Reflex To Free)  11. Therapeutic drug monitoring - Phenobarbital level  12. Medication refill - insulin glargine, 2 Unit Dial, (TOUJEO MAX) 300 UNIT/ML Solostar Pen; Inject 10 Units into the skin daily before breakfast.  Dispense: 9 mL; Refill: 1 - Insulin Pen Needle 32G X 6 MM MISC; 1 Device by Does not apply route daily.  Dispense: 100 each; Refill: 1 - metFORMIN  (GLUCOPHAGE) 1000 MG tablet; Take 1 tablet (1,000 mg total) by mouth 2 (two) times daily with a meal.  Dispense: 180 tablet; Refill: 1 - metoprolol tartrate (LOPRESSOR) 25 MG tablet; Take 1 tablet (25 mg total) by mouth 2 (two) times daily.  Dispense: 270 tablet; Refill: 1 - omeprazole (PRILOSEC) 40 MG capsule; Take 1 capsule (40 mg total) by mouth daily.  Dispense: 90 capsule; Refill: 1 - PHENobarbital (LUMINAL) 32.4 MG tablet; Take 4 tab QHS  Dispense: 120 tablet; Refill: 3 - phenytoin (DILANTIN) 100 MG ER capsule; Take 4 capsules (400 mg total) by mouth at bedtime.  Dispense: 360 capsule; Refill: 1 - sertraline (ZOLOFT) 50 MG tablet; Take 1 tablet (50 mg total) by mouth daily.  Dispense: 90 tablet; Refill: 1 - dapagliflozin propanediol (FARXIGA) 10 MG TABS tablet; TAKE 1 TABLET BY MOUTH BEFORE BREAKFAST  Dispense: 90 tablet; Refill: 1      General Counseling: Brandon Knight verbalizes understanding of the findings of todays visit and agrees with plan of treatment. I have discussed any further diagnostic evaluation that may be needed or ordered today. We also reviewed his medications today. he has been encouraged to call the office with any questions or concerns that should arise related to todays visit.    Orders Placed This Encounter  Procedures   Microscopic Examination   UA/M w/rflx Culture, Routine   Microalbumin, urine   CBC with Differential/Platelet   CMP14+EGFR   Lipid Profile   Vitamin D (25 hydroxy)   TSH + free T4   Phenobarbital level   PSA Total (Reflex To Free)   Cologuard   B12 and Folate Panel   Specimen status report   POCT HgB A1C    Meds ordered this encounter  Medications   insulin glargine, 2 Unit Dial, (TOUJEO MAX) 300 UNIT/ML Solostar Pen    Sig: Inject 10 Units into the skin daily before breakfast.    Dispense:  9 mL    Refill:  1   Insulin Pen Needle 32G X 6 MM MISC    Sig: 1 Device by Does not apply route daily.    Dispense:  100 each    Refill:  1     Please send with toujeo insulin pens; dx code E11.65   metFORMIN (GLUCOPHAGE) 1000 MG tablet    Sig: Take 1 tablet (1,000 mg total) by mouth 2 (two) times daily with a meal.    Dispense:  180 tablet    Refill:  1    Please note increased dosing   metoprolol tartrate (LOPRESSOR) 25 MG tablet    Sig: Take 1 tablet (25 mg total) by mouth 2 (two) times daily.    Dispense:  270 tablet    Refill:  1   omeprazole (PRILOSEC) 40 MG capsule    Sig: Take 1 capsule (40 mg total) by mouth daily.    Dispense:  90 capsule    Refill:  1  PHENobarbital (LUMINAL) 32.4 MG tablet    Sig: Take 4 tab QHS    Dispense:  120 tablet    Refill:  3   phenytoin (DILANTIN) 100 MG ER capsule    Sig: Take 4 capsules (400 mg total) by mouth at bedtime.    Dispense:  360 capsule    Refill:  1   sertraline (ZOLOFT) 50 MG tablet    Sig: Take 1 tablet (50 mg total) by mouth daily.    Dispense:  90 tablet    Refill:  1   dapagliflozin propanediol (FARXIGA) 10 MG TABS tablet    Sig: TAKE 1 TABLET BY MOUTH BEFORE BREAKFAST    Dispense:  90 tablet    Refill:  1    Return in about 3 months (around 07/03/2022) for F/U, Recheck A1C, Cheyenna Pankowski PCP.   Total time spent:30 Minutes Time spent includes review of chart, medications, test results, and follow up plan with the patient.   [place] Controlled Substance Database was reviewed by me.  This patient was seen by Brandon Osgood, FNP-C in collaboration with Dr. Clayborn Bigness as a part of collaborative care agreement.  Rickeya Manus R. Valetta Fuller, MSN, FNP-C Internal medicine

## 2022-04-03 LAB — UA/M W/RFLX CULTURE, ROUTINE
Bilirubin, UA: NEGATIVE
Glucose, UA: NEGATIVE
Ketones, UA: NEGATIVE
Leukocytes,UA: NEGATIVE
Nitrite, UA: NEGATIVE
Protein,UA: NEGATIVE
RBC, UA: NEGATIVE
Specific Gravity, UA: 1.013 (ref 1.005–1.030)
Urobilinogen, Ur: 1 mg/dL (ref 0.2–1.0)
pH, UA: 8 — ABNORMAL HIGH (ref 5.0–7.5)

## 2022-04-03 LAB — MICROSCOPIC EXAMINATION
Bacteria, UA: NONE SEEN
Casts: NONE SEEN /lpf
Epithelial Cells (non renal): NONE SEEN /hpf (ref 0–10)
RBC, Urine: NONE SEEN /hpf (ref 0–2)
WBC, UA: NONE SEEN /hpf (ref 0–5)

## 2022-04-03 LAB — MICROALBUMIN, URINE: Microalbumin, Urine: 11.9 ug/mL

## 2022-04-03 LAB — SPECIMEN STATUS REPORT

## 2022-04-04 LAB — CBC WITH DIFFERENTIAL/PLATELET
Basophils Absolute: 0 10*3/uL (ref 0.0–0.2)
Basos: 1 %
EOS (ABSOLUTE): 0.1 10*3/uL (ref 0.0–0.4)
Eos: 2 %
Hematocrit: 36.6 % — ABNORMAL LOW (ref 37.5–51.0)
Hemoglobin: 11.4 g/dL — ABNORMAL LOW (ref 13.0–17.7)
Immature Grans (Abs): 0 10*3/uL (ref 0.0–0.1)
Immature Granulocytes: 0 %
Lymphocytes Absolute: 1.2 10*3/uL (ref 0.7–3.1)
Lymphs: 19 %
MCH: 24.9 pg — ABNORMAL LOW (ref 26.6–33.0)
MCHC: 31.1 g/dL — ABNORMAL LOW (ref 31.5–35.7)
MCV: 80 fL (ref 79–97)
Monocytes Absolute: 0.5 10*3/uL (ref 0.1–0.9)
Monocytes: 8 %
Neutrophils Absolute: 4.4 10*3/uL (ref 1.4–7.0)
Neutrophils: 70 %
Platelets: 218 10*3/uL (ref 150–450)
RBC: 4.57 x10E6/uL (ref 4.14–5.80)
RDW: 17 % — ABNORMAL HIGH (ref 11.6–15.4)
WBC: 6.2 10*3/uL (ref 3.4–10.8)

## 2022-04-04 LAB — PHENOBARBITAL LEVEL: Phenobarbital, Serum: 16 ug/mL (ref 15–40)

## 2022-04-04 LAB — PSA TOTAL (REFLEX TO FREE): Prostate Specific Ag, Serum: 0.6 ng/mL (ref 0.0–4.0)

## 2022-04-04 LAB — B12 AND FOLATE PANEL
Folate: >20 ng/mL (ref >3.0)
Vitamin B-12: 377 pg/mL (ref 232–1245)

## 2022-04-04 LAB — CMP14+EGFR
ALT: 18 IU/L (ref 0–44)
AST: 13 IU/L (ref 0–40)
Albumin/Globulin Ratio: 1.5 (ref 1.2–2.2)
Albumin: 4.4 g/dL (ref 3.8–4.9)
Alkaline Phosphatase: 97 IU/L (ref 44–121)
BUN/Creatinine Ratio: 21 — ABNORMAL HIGH (ref 9–20)
BUN: 15 mg/dL (ref 6–24)
Bilirubin Total: <0.2 mg/dL (ref 0.0–1.2)
CO2: 24 mmol/L (ref 20–29)
Calcium: 9.2 mg/dL (ref 8.7–10.2)
Chloride: 99 mmol/L (ref 96–106)
Creatinine, Ser: 0.7 mg/dL — ABNORMAL LOW (ref 0.76–1.27)
Globulin, Total: 2.9 g/dL (ref 1.5–4.5)
Glucose: 180 mg/dL — ABNORMAL HIGH (ref 70–99)
Potassium: 4.3 mmol/L (ref 3.5–5.2)
Sodium: 139 mmol/L (ref 134–144)
Total Protein: 7.3 g/dL (ref 6.0–8.5)
eGFR: 107 mL/min/{1.73_m2} (ref >59)

## 2022-04-04 LAB — TSH+FREE T4
Free T4: 1.16 ng/dL (ref 0.82–1.77)
TSH: 1.77 u[IU]/mL (ref 0.450–4.500)

## 2022-04-04 LAB — VITAMIN D 25 HYDROXY (VIT D DEFICIENCY, FRACTURES): Vit D, 25-Hydroxy: 23.7 ng/mL — ABNORMAL LOW (ref 30.0–100.0)

## 2022-04-04 LAB — LIPID PANEL
Chol/HDL Ratio: 4.1 ratio (ref 0.0–5.0)
Cholesterol, Total: 155 mg/dL (ref 100–199)
HDL: 38 mg/dL — ABNORMAL LOW (ref >39)
LDL Chol Calc (NIH): 66 mg/dL (ref 0–99)
Triglycerides: 320 mg/dL — ABNORMAL HIGH (ref 0–149)
VLDL Cholesterol Cal: 51 mg/dL — ABNORMAL HIGH (ref 5–40)

## 2022-04-10 ENCOUNTER — Other Ambulatory Visit: Payer: Self-pay

## 2022-04-11 ENCOUNTER — Telehealth: Payer: Self-pay

## 2022-04-11 ENCOUNTER — Other Ambulatory Visit: Payer: Self-pay

## 2022-04-11 MED ORDER — VITAMIN D3 50 MCG (2000 UT) PO CAPS: 2000.0000 [IU] | ORAL_CAPSULE | Freq: Every day | ORAL | 3 refills | Status: DC

## 2022-04-11 MED ORDER — B-12 1000 MCG PO TABS: 1000.0000 ug | ORAL_TABLET | Freq: Every day | ORAL | 3 refills | Status: DC

## 2022-04-11 MED ORDER — IRON 325 (65 FE) MG PO TABS: 325.0000 mg | ORAL_TABLET | Freq: Every day | ORAL | 3 refills | Status: DC

## 2022-04-11 NOTE — Telephone Encounter (Signed)
04/10/22 spoke to [place] from [facility] and advised that from pt lab results Dr Welton Flakes wants pt to start on OTC Iron, B12 PO and Vit D3 2000 IU.  Delorise Shiner advised that I can send in prescriptions to [facility] for them to add OTC medications to their pill packs.  I sent in all three rx;s to pharmacy for 90 days with 3 refills

## 2022-04-25 LAB — COLOGUARD: COLOGUARD: NEGATIVE

## 2022-04-30 ENCOUNTER — Other Ambulatory Visit: Payer: Self-pay

## 2022-04-30 MED ORDER — CYANOCOBALAMIN 1000 MCG/ML IJ SOLN: 1000.0000 ug | INTRAMUSCULAR | 11 refills | Status: DC

## 2022-05-12 ENCOUNTER — Encounter: Payer: Self-pay | Admitting: Nurse Practitioner

## 2022-06-25 ENCOUNTER — Other Ambulatory Visit: Payer: Self-pay

## 2022-06-25 DIAGNOSIS — E11649 Type 2 diabetes mellitus with hypoglycemia without coma: Secondary | ICD-10-CM

## 2022-06-25 MED ORDER — ACCU-CHEK AVIVA PLUS VI STRP: ORAL_STRIP | 3 refills | Status: DC

## 2022-07-15 ENCOUNTER — Other Ambulatory Visit: Payer: Self-pay

## 2022-07-15 DIAGNOSIS — R21 Rash and other nonspecific skin eruption: Secondary | ICD-10-CM

## 2022-07-15 MED ORDER — NYSTATIN-TRIAMCINOLONE 100000-0.1 UNIT/GM-% EX OINT: 1.0000 | TOPICAL_OINTMENT | CUTANEOUS | 0 refills | Status: DC | PRN

## 2022-08-06 ENCOUNTER — Ambulatory Visit (INDEPENDENT_AMBULATORY_CARE_PROVIDER_SITE_OTHER): Payer: Medicare Other | Admitting: Nurse Practitioner

## 2022-08-06 ENCOUNTER — Encounter: Payer: Self-pay | Admitting: Nurse Practitioner

## 2022-08-06 VITALS — BP 105/66 | HR 105 | Temp 97.8°F | Resp 16 | Ht 64.0 in | Wt 194.6 lb

## 2022-08-06 DIAGNOSIS — I10 Essential (primary) hypertension: Secondary | ICD-10-CM | POA: Diagnosis not present

## 2022-08-06 DIAGNOSIS — Z760 Encounter for issue of repeat prescription: Secondary | ICD-10-CM | POA: Diagnosis not present

## 2022-08-06 DIAGNOSIS — E1169 Type 2 diabetes mellitus with other specified complication: Secondary | ICD-10-CM

## 2022-08-06 DIAGNOSIS — E1165 Type 2 diabetes mellitus with hyperglycemia: Secondary | ICD-10-CM

## 2022-08-06 LAB — POCT GLYCOSYLATED HEMOGLOBIN (HGB A1C): Hemoglobin A1C: 5.9 % — AB (ref 4.0–5.6)

## 2022-08-06 MED ORDER — PHENYTOIN SODIUM EXTENDED 100 MG PO CAPS: 400.0000 mg | ORAL_CAPSULE | Freq: Every day | ORAL | 3 refills | Status: DC

## 2022-08-06 MED ORDER — EZETIMIBE-SIMVASTATIN 10-20 MG PO TABS: 1.0000 | ORAL_TABLET | Freq: Every day | ORAL | 1 refills | Status: DC

## 2022-08-06 MED ORDER — DAPAGLIFLOZIN PROPANEDIOL 10 MG PO TABS: ORAL_TABLET | ORAL | 1 refills | Status: DC

## 2022-08-06 NOTE — Progress Notes (Signed)
[facility] [address] [place], [place] [address]  Internal MEDICINE  Office Visit Note  Patient Name: Brandon Knight  [id number]  [id number]  Date of Service: 08/06/2022  Chief Complaint  Patient presents with   Follow-up   Diabetes   Gastroesophageal Reflux   Hypertension   Hyperlipidemia    HPI Brandon Knight presents for a follow up visit for diabetes, hypertension, GERD, and test results. Diabetes -- significant improvement down to 5.9 today from 6.9 at previous visit.  Hypertension -- well controlled with current medications GERD -- controlled with current medication Negative cologuard test.  He also lost 9 lbs since last visit.     Current Medication: Outpatient Encounter Medications as of 08/06/2022  Medication Sig   Calcium Carb-Cholecalciferol 500-600 MG-UNIT TABS Take 1 tablet by mouth.   chlorhexidine (PERIDEX) 0.12 % solution Use as directed 15 mLs in the mouth or throat 2 (two) times daily.   Cholecalciferol (VITAMIN D3) 50 MCG (2000 UT) capsule Take 1 capsule (2,000 Units total) by mouth daily. OTC   Cyanocobalamin (B-12) 1000 MCG TABS Take 1 tablet (1,000 mcg total) by mouth daily. OTC   cyanocobalamin (VITAMIN B12) 1000 MCG/ML injection Inject 1 mL (1,000 mcg total) into the muscle every 30 (thirty) days.   Ferrous Sulfate (IRON) 325 (65 Fe) MG TABS Take 1 tablet (325 mg total) by mouth daily. OTC   folic acid (FOLVITE) 1 MG tablet Take 1 mg by mouth daily.   glucose blood (ACCU-CHEK AVIVA PLUS) test strip USE TO CHECK BLOOD SUGAR DAILY AS DIRECTED. Dx e11.65   insulin glargine, 2 Unit Dial, (TOUJEO MAX) 300 UNIT/ML Solostar Pen Inject 10 Units into the skin daily before breakfast.   Insulin Pen Needle 32G X 6 MM MISC 1 Device by Does not apply route daily.   loratadine (CLARITIN) 10 MG tablet Take 10 mg by mouth daily.   LORazepam (ATIVAN) 0.5 MG tablet Take 0.5 mg by mouth 2 (two) times daily. Take 1 tab po BID prn   metFORMIN (GLUCOPHAGE) 1000 MG  tablet Take 1 tablet (1,000 mg total) by mouth 2 (two) times daily with a meal.   metoprolol tartrate (LOPRESSOR) 25 MG tablet Take 1 tablet (25 mg total) by mouth 2 (two) times daily.   naproxen (EC NAPROSYN) 500 MG EC tablet Take 500 mg by mouth 2 (two) times daily with a meal.   nystatin-triamcinolone ointment (MYCOLOG) Apply 1 Application topically as needed.   omeprazole (PRILOSEC) 40 MG capsule Take 1 capsule (40 mg total) by mouth daily.   PHENobarbital (LUMINAL) 32.4 MG tablet Take 4 tab QHS   polyethylene glycol (MIRALAX / GLYCOLAX) packet Take 17 g by mouth daily.   sertraline (ZOLOFT) 50 MG tablet Take 1 tablet (50 mg total) by mouth daily.   thioridazine (MELLARIL) 25 MG tablet Take 25 mg by mouth daily. Take 1 tab po am given by psych   thioridazine (MELLARIL) 50 MG tablet Take 50 mg by mouth at bedtime. Take 1 tab qhs by psych   [DISCONTINUED] dapagliflozin propanediol (FARXIGA) 10 MG TABS tablet TAKE 1 TABLET BY MOUTH BEFORE BREAKFAST   [DISCONTINUED] ezetimibe-simvastatin (VYTORIN) 10-20 MG tablet Take 1 tablet by mouth at bedtime.   [DISCONTINUED] phenytoin (DILANTIN) 100 MG ER capsule Take 4 capsules (400 mg total) by mouth at bedtime.   dapagliflozin propanediol (FARXIGA) 10 MG TABS tablet TAKE 1 TABLET BY MOUTH BEFORE BREAKFAST   ezetimibe-simvastatin (VYTORIN) 10-20 MG tablet Take 1 tablet by mouth at bedtime.  phenytoin (DILANTIN) 100 MG ER capsule Take 4 capsules (400 mg total) by mouth at bedtime.   No facility-administered encounter medications on file as of 08/06/2022.    Surgical History: Past Surgical History:  Procedure Laterality Date   NO PAST SURGERIES      Medical History: Past Medical History:  Diagnosis Date   Anxiety    Diabetes mellitus without complication (HCC)    GERD (gastroesophageal reflux disease)    Hyperlipidemia    Hypertension    Seizures (HCC)     Family History: Family History  Family history unknown: Yes    Social History    Socioeconomic History   Marital status: Single    Spouse name: Not on file   Number of children: Not on file   Years of education: Not on file   Highest education level: Not on file  Occupational History   Not on file  Tobacco Use   Smoking status: Never   Smokeless tobacco: Never  Vaping Use   Vaping Use: Never used  Substance and Sexual Activity   Alcohol use: No   Drug use: No   Sexual activity: Not on file  Other Topics Concern   Not on file  Social History Narrative   Not on file   Social Determinants of Health   Financial Resource Strain: Not on file  Food Insecurity: Not on file  Transportation Needs: Not on file  Physical Activity: Not on file  Stress: Not on file  Social Connections: Not on file  Intimate Partner Violence: Not on file      Review of Systems  Constitutional:  Negative for chills, fatigue and unexpected weight change.  HENT:  Negative for congestion, rhinorrhea, sneezing and sore throat.   Eyes:  Negative for redness.  Respiratory:  Negative for cough, chest tightness and shortness of breath.   Cardiovascular:  Negative for chest pain and palpitations.  Gastrointestinal:  Negative for abdominal pain, constipation, diarrhea, nausea and vomiting.  Genitourinary:  Negative for dysuria and frequency.  Musculoskeletal:  Negative for arthralgias, back pain, joint swelling and neck pain.  Skin:  Negative for rash.  Neurological: Negative.  Negative for tremors and numbness.  Hematological:  Negative for adenopathy. Does not bruise/bleed easily.  Psychiatric/Behavioral:  Negative for behavioral problems (Depression), sleep disturbance and suicidal ideas. The patient is not nervous/anxious.     Vital Signs: BP 105/66   Pulse (!) 105   Temp 97.8 F (36.6 C)   Resp 16   Ht 5\' 4"  (1.626 m)   Wt 194 lb 9.6 oz (88.3 kg)   SpO2 95%   BMI 33.40 kg/m    Physical Exam Vitals reviewed.  Constitutional:      General: He is not in acute  distress.    Appearance: Normal appearance. He is obese. He is not ill-appearing.  HENT:     Head: Normocephalic and atraumatic.  Eyes:     Pupils: Pupils are equal, round, and reactive to light.  Cardiovascular:     Rate and Rhythm: Normal rate and regular rhythm.  Pulmonary:     Effort: Pulmonary effort is normal. No respiratory distress.  Neurological:     Mental Status: He is alert and oriented to person, place, and time.  Psychiatric:        Mood and Affect: Mood normal.        Behavior: Behavior normal.        Assessment/Plan: 1. Uncontrolled type 2 diabetes mellitus with hyperglycemia (HCC) Significantly  improved A1C, glucose log is stable. Continue current medications as prescribed. Lost 9 lbs as well - POCT glycosylated hemoglobin (Hb A1C)  2. Essential hypertension Stable, continue medications as prescribed.   3. Medication refill - ezetimibe-simvastatin (VYTORIN) 10-20 MG tablet; Take 1 tablet by mouth at bedtime.  Dispense: 90 tablet; Refill: 1 - dapagliflozin propanediol (FARXIGA) 10 MG TABS tablet; TAKE 1 TABLET BY MOUTH BEFORE BREAKFAST  Dispense: 90 tablet; Refill: 1 - phenytoin (DILANTIN) 100 MG ER capsule; Take 4 capsules (400 mg total) by mouth at bedtime.  Dispense: 360 capsule; Refill: 3   General Counseling: Jlen verbalizes understanding of the findings of todays visit and agrees with plan of treatment. I have discussed any further diagnostic evaluation that may be needed or ordered today. We also reviewed his medications today. he has been encouraged to call the office with any questions or concerns that should arise related to todays visit.    Orders Placed This Encounter  Procedures   POCT glycosylated hemoglobin (Hb A1C)    Meds ordered this encounter  Medications   ezetimibe-simvastatin (VYTORIN) 10-20 MG tablet    Sig: Take 1 tablet by mouth at bedtime.    Dispense:  90 tablet    Refill:  1   dapagliflozin propanediol (FARXIGA) 10 MG  TABS tablet    Sig: TAKE 1 TABLET BY MOUTH BEFORE BREAKFAST    Dispense:  90 tablet    Refill:  1   phenytoin (DILANTIN) 100 MG ER capsule    Sig: Take 4 capsules (400 mg total) by mouth at bedtime.    Dispense:  360 capsule    Refill:  3    Return in about 3 months (around 11/13/2022) for F/U, Recheck A1C, Ryne Mctigue PCP.   Total time spent:30 Minutes Time spent includes review of chart, medications, test results, and follow up plan with the patient.   [place] Controlled Substance Database was reviewed by me.  This patient was seen by Sallyanne Kuster, FNP-C in collaboration with Dr. Beverely Risen as a part of collaborative care agreement.   Jayceon Troy R. Tedd Sias, MSN, FNP-C Internal medicine

## 2022-08-11 ENCOUNTER — Encounter: Payer: Self-pay | Admitting: Nurse Practitioner

## 2022-09-09 ENCOUNTER — Telehealth: Payer: Self-pay

## 2022-09-09 ENCOUNTER — Other Ambulatory Visit: Payer: Self-pay | Admitting: Nurse Practitioner

## 2022-09-09 DIAGNOSIS — Z760 Encounter for issue of repeat prescription: Secondary | ICD-10-CM

## 2022-09-09 MED ORDER — PHENOBARBITAL 32.4 MG PO TABS: ORAL_TABLET | ORAL | 3 refills | Status: DC

## 2022-09-09 MED ORDER — CYANOCOBALAMIN 1000 MCG/ML IJ SOLN
1000.0000 ug | INTRAMUSCULAR | 11 refills | Status: DC
Start: 2022-09-09 — End: 2022-11-08

## 2022-09-09 MED ORDER — "LUER LOCK SAFETY SYRINGES 22G X 1"" 3 ML MISC": 0 refills | Status: DC

## 2022-09-09 NOTE — Progress Notes (Signed)
Refills of phenobarbital sent to pharmacy  Also will continue monthly B12 injections, will send prescription for B12 and syringes.

## 2022-09-09 NOTE — Telephone Encounter (Signed)
[place]  home health gave  jeff  [phone] as per alyssa continue monthly B12

## 2022-10-02 ENCOUNTER — Other Ambulatory Visit: Payer: Self-pay | Admitting: Nurse Practitioner

## 2022-10-02 DIAGNOSIS — Z760 Encounter for issue of repeat prescription: Secondary | ICD-10-CM

## 2022-10-02 MED ORDER — PHENOBARBITAL 32.4 MG PO TABS: ORAL_TABLET | ORAL | 3 refills | Status: DC

## 2022-10-18 ENCOUNTER — Telehealth: Payer: Self-pay

## 2022-10-18 NOTE — Telephone Encounter (Signed)
Faxed for Listerine to continue and d/C Peridex and faxed to [phone]

## 2022-11-07 ENCOUNTER — Ambulatory Visit: Payer: Medicare Other | Admitting: Nurse Practitioner

## 2022-11-08 ENCOUNTER — Ambulatory Visit (INDEPENDENT_AMBULATORY_CARE_PROVIDER_SITE_OTHER): Payer: Medicare Other | Admitting: Nurse Practitioner

## 2022-11-08 ENCOUNTER — Encounter: Payer: Self-pay | Admitting: Nurse Practitioner

## 2022-11-08 VITALS — BP 102/66 | HR 103 | Temp 97.8°F | Resp 16 | Ht 64.0 in | Wt 185.4 lb

## 2022-11-08 DIAGNOSIS — E1165 Type 2 diabetes mellitus with hyperglycemia: Secondary | ICD-10-CM | POA: Diagnosis not present

## 2022-11-08 DIAGNOSIS — E538 Deficiency of other specified B group vitamins: Secondary | ICD-10-CM

## 2022-11-08 DIAGNOSIS — G40909 Epilepsy, unspecified, not intractable, without status epilepticus: Secondary | ICD-10-CM

## 2022-11-08 LAB — POCT GLYCOSYLATED HEMOGLOBIN (HGB A1C): Hemoglobin A1C: 5.7 % — AB (ref 4.0–5.6)

## 2022-11-08 MED ORDER — PHENOBARBITAL 32.4 MG PO TABS: ORAL_TABLET | ORAL | 3 refills | Status: DC

## 2022-11-08 MED ORDER — VITAMIN B-12 1000 MCG PO TABS: 1000.0000 ug | ORAL_TABLET | Freq: Every day | ORAL | 1 refills | Status: DC

## 2022-11-08 NOTE — Progress Notes (Signed)
[facility] [place], [place] [address]  Internal MEDICINE  Office Visit Note  Patient Name: Brandon Knight  [id number]  [id number]  Date of Service: 11/08/2022  Chief Complaint  Patient presents with   Follow-up    HPI Brandon Knight presents for a follow-up visit for diabetes, seizures, medication review Diabetes -- A1c improved to 5.7 continuing to do well. Stable, on farxiga, metformin and toujeo.  Seizures -- stable, needs refills  Medication list review for caregivers, updated and signed off.     Current Medication: Outpatient Encounter Medications as of 11/08/2022  Medication Sig   Calcium Carb-Cholecalciferol 500-600 MG-UNIT TABS Take 1 tablet by mouth.   chlorhexidine (PERIDEX) 0.12 % solution Use as directed 15 mLs in the mouth or throat 2 (two) times daily.   Cholecalciferol (VITAMIN D3) 50 MCG (2000 UT) capsule Take 1 capsule (2,000 Units total) by mouth daily. OTC   cyanocobalamin (VITAMIN B12) 1000 MCG tablet Take 1 tablet (1,000 mcg total) by mouth daily.   dapagliflozin propanediol (FARXIGA) 10 MG TABS tablet TAKE 1 TABLET BY MOUTH BEFORE BREAKFAST   ezetimibe-simvastatin (VYTORIN) 10-20 MG tablet Take 1 tablet by mouth at bedtime.   Ferrous Sulfate (IRON) 325 (65 Fe) MG TABS Take 1 tablet (325 mg total) by mouth daily. OTC   folic acid (FOLVITE) 1 MG tablet Take 1 mg by mouth daily.   glucose blood (ACCU-CHEK AVIVA PLUS) test strip USE TO CHECK BLOOD SUGAR DAILY AS DIRECTED. Dx e11.65   insulin glargine, 2 Unit Dial, (TOUJEO MAX) 300 UNIT/ML Solostar Pen Inject 10 Units into the skin daily before breakfast.   Insulin Pen Needle 32G X 6 MM MISC 1 Device by Does not apply route daily.   loratadine (CLARITIN) 10 MG tablet Take 10 mg by mouth daily.   LORazepam (ATIVAN) 0.5 MG tablet Take 0.5 mg by mouth 2 (two) times daily. Take 1 tab po BID prn   metFORMIN (GLUCOPHAGE) 1000 MG tablet Take 1 tablet (1,000 mg total) by mouth 2 (two) times daily with  a meal.   metoprolol tartrate (LOPRESSOR) 25 MG tablet Take 1 tablet (25 mg total) by mouth 2 (two) times daily.   naproxen (EC NAPROSYN) 500 MG EC tablet Take 500 mg by mouth 2 (two) times daily with a meal.   nystatin-triamcinolone ointment (MYCOLOG) Apply 1 Application topically as needed.   omeprazole (PRILOSEC) 40 MG capsule Take 1 capsule (40 mg total) by mouth daily.   phenytoin (DILANTIN) 100 MG ER capsule Take 4 capsules (400 mg total) by mouth at bedtime.   polyethylene glycol (MIRALAX / GLYCOLAX) packet Take 17 g by mouth daily.   sertraline (ZOLOFT) 50 MG tablet Take 1 tablet (50 mg total) by mouth daily.   SYRINGE-NEEDLE, DISP, 3 ML (LUER LOCK SAFETY SYRINGES) 22G X 1" 3 ML MISC Please use 1 syringe monthly for IM injection of B12   thioridazine (MELLARIL) 25 MG tablet Take 25 mg by mouth daily. Take 1 tab po am given by psych   thioridazine (MELLARIL) 50 MG tablet Take 50 mg by mouth at bedtime. Take 1 tab qhs by psych   [DISCONTINUED] cyanocobalamin (VITAMIN B12) 1000 MCG/ML injection Inject 1 mL (1,000 mcg total) into the muscle every 30 (thirty) days.   [DISCONTINUED] PHENobarbital (LUMINAL) 32.4 MG tablet Take 4 tab QHS   PHENobarbital (LUMINAL) 32.4 MG tablet Take 4 tab QHS   No facility-administered encounter medications on file as of 11/08/2022.    Surgical History: Past  Surgical History:  Procedure Laterality Date   NO PAST SURGERIES      Medical History: Past Medical History:  Diagnosis Date   Anxiety    Diabetes mellitus without complication (HCC)    GERD (gastroesophageal reflux disease)    Hyperlipidemia    Hypertension    Seizures ([place])     Family History: Family History  Family history unknown: Yes    Social History   Socioeconomic History   Marital status: Single    Spouse name: Not on file   Number of children: Not on file   Years of education: Not on file   Highest education level: Not on file  Occupational History   Not on file  Tobacco  Use   Smoking status: Never   Smokeless tobacco: Never  Vaping Use   Vaping Use: Never used  Substance and Sexual Activity   Alcohol use: No   Drug use: No   Sexual activity: Not on file  Other Topics Concern   Not on file  Social History Narrative   Not on file   Social Determinants of Health   Financial Resource Strain: Not on file  Food Insecurity: Not on file  Transportation Needs: Not on file  Physical Activity: Not on file  Stress: Not on file  Social Connections: Not on file  Intimate Partner Violence: Not on file      Review of Systems  Constitutional:  Negative for chills, fatigue and unexpected weight change.  HENT:  Negative for congestion, rhinorrhea, sneezing and sore throat.   Eyes:  Negative for redness.  Respiratory:  Negative for cough, chest tightness and shortness of breath.   Cardiovascular:  Negative for chest pain and palpitations.  Gastrointestinal:  Negative for abdominal pain, constipation, diarrhea, nausea and vomiting.  Genitourinary:  Negative for dysuria and frequency.  Musculoskeletal:  Negative for arthralgias, back pain, joint swelling and neck pain.  Skin:  Negative for rash.  Neurological: Negative.  Negative for tremors and numbness.  Hematological:  Negative for adenopathy. Does not bruise/bleed easily.  Psychiatric/Behavioral:  Negative for behavioral problems (Depression), sleep disturbance and suicidal ideas. The patient is not nervous/anxious.     Vital Signs: BP 102/66   Pulse (!) 103   Temp 97.8 F (36.6 C)   Resp 16   Ht '5\' 4"'$  (1.626 m)   Wt 185 lb 6.4 oz (84.1 kg)   SpO2 98%   BMI 31.82 kg/m    Physical Exam Vitals reviewed.  Constitutional:      General: He is not in acute distress.    Appearance: Normal appearance. He is obese. He is not ill-appearing.  HENT:     Head: Normocephalic and atraumatic.  Eyes:     Pupils: Pupils are equal, round, and reactive to light.  Cardiovascular:     Rate and Rhythm:  Normal rate and regular rhythm.  Pulmonary:     Effort: Pulmonary effort is normal. No respiratory distress.  Neurological:     Mental Status: He is alert and oriented to person, place, and time.  Psychiatric:        Mood and Affect: Mood normal.        Behavior: Behavior normal.        Assessment/Plan: 1. Uncontrolled type 2 diabetes mellitus with hyperglycemia (HCC) Stable, A1c improved further to 5.7, will recheck in August.  - POCT glycosylated hemoglobin (Hb A1C)  2. Seizure disorder ([place]) Refills ordered, continue phenobarbital as prescribed.  - PHENobarbital (LUMINAL) 32.4 MG  tablet; Take 4 tab QHS  Dispense: 120 tablet; Refill: 3  3. B12 deficiency Discontinue B12 injections. Continue PO B12 supplement as prescribed. - cyanocobalamin (VITAMIN B12) 1000 MCG tablet; Take 1 tablet (1,000 mcg total) by mouth daily.  Dispense: 90 tablet; Refill: 1   General Counseling: Brandon Knight verbalizes understanding of the findings of todays visit and agrees with plan of treatment. I have discussed any further diagnostic evaluation that may be needed or ordered today. We also reviewed his medications today. he has been encouraged to call the office with any questions or concerns that should arise related to todays visit.    Orders Placed This Encounter  Procedures   POCT glycosylated hemoglobin (Hb A1C)    Meds ordered this encounter  Medications   PHENobarbital (LUMINAL) 32.4 MG tablet    Sig: Take 4 tab QHS    Dispense:  120 tablet    Refill:  3    For next fill   cyanocobalamin (VITAMIN B12) 1000 MCG tablet    Sig: Take 1 tablet (1,000 mcg total) by mouth daily.    Dispense:  90 tablet    Refill:  1    Return for previously scheduled, CPE, Fransico Sciandra PCP in august and will recheck a1c then.   Total time spent:30 Minutes Time spent includes review of chart, medications, test results, and follow up plan with the patient.   [place] Controlled Substance Database was reviewed by  me.  This patient was seen by Jonetta Osgood, FNP-C in collaboration with Dr. Clayborn Bigness as a part of collaborative care agreement.   Bradely Rudin R. Valetta Fuller, MSN, FNP-C Internal medicine

## 2022-11-18 ENCOUNTER — Other Ambulatory Visit: Payer: Self-pay

## 2022-11-18 ENCOUNTER — Telehealth: Payer: Self-pay

## 2022-11-18 NOTE — Telephone Encounter (Signed)
Brandon Knight years called that pt need to take toujeo everyday even though they are checking his glucose 3 times a week as per alyssa advised her yes continue all diabetic med as prescribed  as prescribed  as well as glucose is stable they can check glucose 3 times a week

## 2022-12-27 ENCOUNTER — Telehealth: Payer: Self-pay | Admitting: Nurse Practitioner

## 2022-12-27 NOTE — Telephone Encounter (Signed)
Notified patient FL2 form is competed and ready to pick up. Scanned-Toni

## 2023-01-02 ENCOUNTER — Telehealth: Payer: Self-pay

## 2023-01-02 NOTE — Telephone Encounter (Signed)
[facility] [phone] called to clarify that we stopped B12 injection as per alyssa advised that yes we switch to tab

## 2023-01-16 ENCOUNTER — Encounter: Payer: Self-pay | Admitting: Nurse Practitioner

## 2023-01-16 ENCOUNTER — Telehealth: Payer: Self-pay | Admitting: Nurse Practitioner

## 2023-01-16 ENCOUNTER — Ambulatory Visit (INDEPENDENT_AMBULATORY_CARE_PROVIDER_SITE_OTHER): Payer: Medicare Other | Admitting: Nurse Practitioner

## 2023-01-16 VITALS — BP 126/80 | HR 106 | Temp 96.8°F | Resp 16 | Ht 64.0 in | Wt 184.6 lb

## 2023-01-16 DIAGNOSIS — G40909 Epilepsy, unspecified, not intractable, without status epilepticus: Secondary | ICD-10-CM | POA: Diagnosis not present

## 2023-01-16 DIAGNOSIS — G4733 Obstructive sleep apnea (adult) (pediatric): Secondary | ICD-10-CM | POA: Diagnosis not present

## 2023-01-16 DIAGNOSIS — Z79899 Other long term (current) drug therapy: Secondary | ICD-10-CM | POA: Diagnosis not present

## 2023-01-16 DIAGNOSIS — E1165 Type 2 diabetes mellitus with hyperglycemia: Secondary | ICD-10-CM | POA: Diagnosis not present

## 2023-01-16 MED ORDER — INSULIN GLARGINE (2 UNIT DIAL) 300 UNIT/ML ~~LOC~~ SOPN
10.0000 [IU] | PEN_INJECTOR | Freq: Every day | SUBCUTANEOUS | 1 refills | Status: DC
Start: 2023-01-16 — End: 2023-04-08

## 2023-01-16 MED ORDER — EZETIMIBE-SIMVASTATIN 10-20 MG PO TABS
1.0000 | ORAL_TABLET | Freq: Every day | ORAL | 1 refills | Status: DC
Start: 2023-01-16 — End: 2023-04-08

## 2023-01-16 MED ORDER — PHENOBARBITAL 32.4 MG PO TABS: ORAL_TABLET | ORAL | 3 refills | Status: DC

## 2023-01-16 MED ORDER — GLUCOSE BLOOD VI STRP
ORAL_STRIP | 12 refills | Status: AC
Start: 2023-01-16 — End: ?

## 2023-01-16 MED ORDER — TRUE METRIX METER W/DEVICE KIT: 1.0000 | PACK | Freq: Once | 0 refills | Status: AC

## 2023-01-16 MED ORDER — SERTRALINE HCL 50 MG PO TABS: 50.0000 mg | ORAL_TABLET | Freq: Every day | ORAL | 1 refills | Status: DC

## 2023-01-16 MED ORDER — IRON 325 (65 FE) MG PO TABS: 325.0000 mg | ORAL_TABLET | Freq: Every day | ORAL | 3 refills | Status: DC

## 2023-01-16 MED ORDER — METOPROLOL TARTRATE 25 MG PO TABS: 25.0000 mg | ORAL_TABLET | Freq: Two times a day (BID) | ORAL | 1 refills | Status: DC

## 2023-01-16 MED ORDER — DAPAGLIFLOZIN PROPANEDIOL 10 MG PO TABS: ORAL_TABLET | ORAL | 1 refills | Status: DC

## 2023-01-16 MED ORDER — VITAMIN D3 50 MCG (2000 UT) PO CAPS
2000.0000 [IU] | ORAL_CAPSULE | Freq: Every day | ORAL | 3 refills | Status: DC
Start: 2023-01-16 — End: 2023-04-08

## 2023-01-16 MED ORDER — METFORMIN HCL 1000 MG PO TABS
1000.0000 mg | ORAL_TABLET | Freq: Two times a day (BID) | ORAL | 1 refills | Status: DC
Start: 2023-01-16 — End: 2023-04-08

## 2023-01-16 MED ORDER — INSULIN PEN NEEDLE 32G X 6 MM MISC
1.0000 | Freq: Every day | 1 refills | Status: DC
Start: 2023-01-16 — End: 2023-04-08

## 2023-01-16 MED ORDER — OMEPRAZOLE 40 MG PO CPDR: 40.0000 mg | DELAYED_RELEASE_CAPSULE | Freq: Every day | ORAL | 1 refills | Status: DC

## 2023-01-16 NOTE — Telephone Encounter (Signed)
Notified Ashly with [facility] of cpap order-Toni

## 2023-01-16 NOTE — Progress Notes (Signed)
[facility] [address] [place], [place] [address]  Internal MEDICINE  Office Visit Note  Patient Name: Brandon Knight  [id number]  [id number]  Date of Service: 01/16/2023  Chief Complaint  Patient presents with   Diabetes   Gastroesophageal Reflux   Hypertension   Hyperlipidemia    HPI Brandon Knight presents for a follow-up visit for new CPAP machine and refills. Needs new CPAP machine, current one is more than 58 years old and does not work anymore.  Uses [facility]. Original sleep study and titration study was uploaded into his chart.  Due for refills on his medications.  No other issues or concerns.    Current Medication: Outpatient Encounter Medications as of 01/16/2023  Medication Sig   Blood Glucose Monitoring Suppl (TRUE METRIX METER) w/Device KIT 1 Device by Does not apply route once for 1 dose.   Calcium Carb-Cholecalciferol 500-600 MG-UNIT TABS Take 1 tablet by mouth.   chlorhexidine (PERIDEX) 0.12 % solution Use as directed 15 mLs in the mouth or throat 2 (two) times daily.   cyanocobalamin (VITAMIN B12) 1000 MCG tablet Take 1 tablet (1,000 mcg total) by mouth daily.   folic acid (FOLVITE) 1 MG tablet Take 1 mg by mouth daily.   glucose blood test strip Use 1 test strip on Monday, Wednesday and Friday to check glucose with glucose meter.   loratadine (CLARITIN) 10 MG tablet Take 10 mg by mouth daily.   LORazepam (ATIVAN) 0.5 MG tablet Take 0.5 mg by mouth 2 (two) times daily. Take 1 tab po BID prn   naproxen (EC NAPROSYN) 500 MG EC tablet Take 500 mg by mouth 2 (two) times daily with a meal.   nystatin-triamcinolone ointment (MYCOLOG) Apply 1 Application topically as needed.   phenytoin (DILANTIN) 100 MG ER capsule Take 4 capsules (400 mg total) by mouth at bedtime.   polyethylene glycol (MIRALAX / GLYCOLAX) packet Take 17 g by mouth daily.   thioridazine (MELLARIL) 25 MG tablet Take 25 mg by mouth daily. Take 1 tab po am given by psych   thioridazine  (MELLARIL) 50 MG tablet Take 50 mg by mouth at bedtime. Take 1 tab qhs by psych   [DISCONTINUED] Cholecalciferol (VITAMIN D3) 50 MCG (2000 UT) capsule Take 1 capsule (2,000 Units total) by mouth daily. OTC   [DISCONTINUED] dapagliflozin propanediol (FARXIGA) 10 MG TABS tablet TAKE 1 TABLET BY MOUTH BEFORE BREAKFAST   [DISCONTINUED] ezetimibe-simvastatin (VYTORIN) 10-20 MG tablet Take 1 tablet by mouth at bedtime.   [DISCONTINUED] Ferrous Sulfate (IRON) 325 (65 Fe) MG TABS Take 1 tablet (325 mg total) by mouth daily. OTC   [DISCONTINUED] glucose blood (ACCU-CHEK AVIVA PLUS) test strip USE TO CHECK BLOOD SUGAR DAILY AS DIRECTED. Dx e11.65   [DISCONTINUED] insulin glargine, 2 Unit Dial, (TOUJEO MAX) 300 UNIT/ML Solostar Pen Inject 10 Units into the skin daily before breakfast.   [DISCONTINUED] Insulin Pen Needle 32G X 6 MM MISC 1 Device by Does not apply route daily.   [DISCONTINUED] metFORMIN (GLUCOPHAGE) 1000 MG tablet Take 1 tablet (1,000 mg total) by mouth 2 (two) times daily with a meal.   [DISCONTINUED] metoprolol tartrate (LOPRESSOR) 25 MG tablet Take 1 tablet (25 mg total) by mouth 2 (two) times daily.   [DISCONTINUED] omeprazole (PRILOSEC) 40 MG capsule Take 1 capsule (40 mg total) by mouth daily.   [DISCONTINUED] PHENobarbital (LUMINAL) 32.4 MG tablet Take 4 tab QHS   [DISCONTINUED] sertraline (ZOLOFT) 50 MG tablet Take 1 tablet (50 mg total) by mouth daily.   [  DISCONTINUED] SYRINGE-NEEDLE, DISP, 3 ML (LUER LOCK SAFETY SYRINGES) 22G X 1" 3 ML MISC Please use 1 syringe monthly for IM injection of B12   Cholecalciferol (VITAMIN D3) 50 MCG (2000 UT) capsule Take 1 capsule (2,000 Units total) by mouth daily. OTC   dapagliflozin propanediol (FARXIGA) 10 MG TABS tablet TAKE 1 TABLET BY MOUTH BEFORE BREAKFAST   ezetimibe-simvastatin (VYTORIN) 10-20 MG tablet Take 1 tablet by mouth at bedtime.   Ferrous Sulfate (IRON) 325 (65 Fe) MG TABS Take 1 tablet (325 mg total) by mouth daily. OTC   insulin  glargine, 2 Unit Dial, (TOUJEO MAX) 300 UNIT/ML Solostar Pen Inject 10 Units into the skin daily before breakfast.   Insulin Pen Needle 32G X 6 MM MISC 1 Device by Does not apply route daily.   metFORMIN (GLUCOPHAGE) 1000 MG tablet Take 1 tablet (1,000 mg total) by mouth 2 (two) times daily with a meal.   metoprolol tartrate (LOPRESSOR) 25 MG tablet Take 1 tablet (25 mg total) by mouth 2 (two) times daily.   omeprazole (PRILOSEC) 40 MG capsule Take 1 capsule (40 mg total) by mouth daily.   PHENobarbital (LUMINAL) 32.4 MG tablet Take 4 tab QHS   sertraline (ZOLOFT) 50 MG tablet Take 1 tablet (50 mg total) by mouth daily.   No facility-administered encounter medications on file as of 01/16/2023.    Surgical History: Past Surgical History:  Procedure Laterality Date   NO PAST SURGERIES      Medical History: Past Medical History:  Diagnosis Date   Anxiety    Diabetes mellitus without complication (HCC)    GERD (gastroesophageal reflux disease)    Hyperlipidemia    Hypertension    Seizures (HCC)     Family History: Family History  Family history unknown: Yes    Social History   Socioeconomic History   Marital status: Single    Spouse name: Not on file   Number of children: Not on file   Years of education: Not on file   Highest education level: Not on file  Occupational History   Not on file  Tobacco Use   Smoking status: Never   Smokeless tobacco: Never  Vaping Use   Vaping Use: Never used  Substance and Sexual Activity   Alcohol use: No   Drug use: No   Sexual activity: Not on file  Other Topics Concern   Not on file  Social History Narrative   Not on file   Social Determinants of Health   Financial Resource Strain: Not on file  Food Insecurity: Not on file  Transportation Needs: Not on file  Physical Activity: Not on file  Stress: Not on file  Social Connections: Not on file  Intimate Partner Violence: Not on file      Review of Systems   Constitutional:  Negative for chills, fatigue and unexpected weight change.  HENT:  Negative for congestion, rhinorrhea, sneezing and sore throat.   Eyes:  Negative for redness.  Respiratory:  Negative for cough, chest tightness and shortness of breath.   Cardiovascular:  Negative for chest pain and palpitations.  Gastrointestinal:  Negative for abdominal pain, constipation, diarrhea, nausea and vomiting.  Genitourinary:  Negative for dysuria and frequency.  Musculoskeletal:  Negative for arthralgias, back pain, joint swelling and neck pain.  Skin:  Negative for rash.  Neurological: Negative.  Negative for tremors and numbness.  Hematological:  Negative for adenopathy. Does not bruise/bleed easily.  Psychiatric/Behavioral:  Negative for behavioral problems (Depression), sleep disturbance  and suicidal ideas. The patient is not nervous/anxious.     Vital Signs: BP 126/80   Pulse (!) 106   Temp (!) 96.8 F (36 C)   Resp 16   Ht 5\' 4"  (1.626 m)   Wt 184 lb 9.6 oz (83.7 kg)   SpO2 95%   BMI 31.69 kg/m    Physical Exam Vitals reviewed.  Constitutional:      General: He is not in acute distress.    Appearance: Normal appearance. He is obese. He is not ill-appearing.  HENT:     Head: Normocephalic and atraumatic.  Eyes:     Pupils: Pupils are equal, round, and reactive to light.  Cardiovascular:     Rate and Rhythm: Normal rate and regular rhythm.  Pulmonary:     Effort: Pulmonary effort is normal. No respiratory distress.  Neurological:     Mental Status: He is alert and oriented to person, place, and time.  Psychiatric:        Mood and Affect: Mood normal.        Behavior: Behavior normal.        Assessment/Plan: 1. OSA on CPAP New CPAP order placed for an Auto-PAP to [facility].  - For home use only DME continuous positive airway pressure (CPAP)  2. Uncontrolled type 2 diabetes mellitus with hyperglycemia (HCC) Continue medications as prescribed  Switch to true  metrix glucose meter, needs a new one, order sent  - Blood Glucose Monitoring Suppl (TRUE METRIX METER) w/Device KIT; 1 Device by Does not apply route once for 1 dose.  Dispense: 1 kit; Refill: 0 - glucose blood test strip; Use 1 test strip on Monday, Wednesday and Friday to check glucose with glucose meter.  Dispense: 100 each; Refill: 12 - Insulin Pen Needle 32G X 6 MM MISC; 1 Device by Does not apply route daily.  Dispense: 100 each; Refill: 1 - insulin glargine, 2 Unit Dial, (TOUJEO MAX) 300 UNIT/ML Solostar Pen; Inject 10 Units into the skin daily before breakfast.  Dispense: 9 mL; Refill: 1 - dapagliflozin propanediol (FARXIGA) 10 MG TABS tablet; TAKE 1 TABLET BY MOUTH BEFORE BREAKFAST  Dispense: 90 tablet; Refill: 1 - metFORMIN (GLUCOPHAGE) 1000 MG tablet; Take 1 tablet (1,000 mg total) by mouth 2 (two) times daily with a meal.  Dispense: 180 tablet; Refill: 1  3. Seizure disorder (HCC) Continue phenobarbital as prescribed, refills ordered  - PHENobarbital (LUMINAL) 32.4 MG tablet; Take 4 tab QHS  Dispense: 120 tablet; Refill: 3  4. Encounter for medication review Medication list reviewed with patient and caregiver, updated and refills ordered  - metoprolol tartrate (LOPRESSOR) 25 MG tablet; Take 1 tablet (25 mg total) by mouth 2 (two) times daily.  Dispense: 270 tablet; Refill: 1 - Ferrous Sulfate (IRON) 325 (65 Fe) MG TABS; Take 1 tablet (325 mg total) by mouth daily. OTC  Dispense: 90 tablet; Refill: 3 - ezetimibe-simvastatin (VYTORIN) 10-20 MG tablet; Take 1 tablet by mouth at bedtime.  Dispense: 90 tablet; Refill: 1 - Cholecalciferol (VITAMIN D3) 50 MCG (2000 UT) capsule; Take 1 capsule (2,000 Units total) by mouth daily. OTC  Dispense: 90 capsule; Refill: 3 - omeprazole (PRILOSEC) 40 MG capsule; Take 1 capsule (40 mg total) by mouth daily.  Dispense: 90 capsule; Refill: 1 - sertraline (ZOLOFT) 50 MG tablet; Take 1 tablet (50 mg total) by mouth daily.  Dispense: 90 tablet; Refill:  1   General Counseling: Brandon Knight verbalizes understanding of the findings of todays visit and agrees with plan  of treatment. I have discussed any further diagnostic evaluation that may be needed or ordered today. We also reviewed his medications today. he has been encouraged to call the office with any questions or concerns that should arise related to todays visit.    Orders Placed This Encounter  Procedures   For home use only DME continuous positive airway pressure (CPAP)    Meds ordered this encounter  Medications   PHENobarbital (LUMINAL) 32.4 MG tablet    Sig: Take 4 tab QHS    Dispense:  120 tablet    Refill:  3    For next fill   metoprolol tartrate (LOPRESSOR) 25 MG tablet    Sig: Take 1 tablet (25 mg total) by mouth 2 (two) times daily.    Dispense:  270 tablet    Refill:  1   Blood Glucose Monitoring Suppl (TRUE METRIX METER) w/Device KIT    Sig: 1 Device by Does not apply route once for 1 dose.    Dispense:  1 kit    Refill:  0    Dx code E11.65   glucose blood test strip    Sig: Use 1 test strip on Monday, Wednesday and Friday to check glucose with glucose meter.    Dispense:  100 each    Refill:  12    Dx code E11.65   Insulin Pen Needle 32G X 6 MM MISC    Sig: 1 Device by Does not apply route daily.    Dispense:  100 each    Refill:  1    Please send with toujeo insulin pens; dx code E11.65   insulin glargine, 2 Unit Dial, (TOUJEO MAX) 300 UNIT/ML Solostar Pen    Sig: Inject 10 Units into the skin daily before breakfast.    Dispense:  9 mL    Refill:  1   Ferrous Sulfate (IRON) 325 (65 Fe) MG TABS    Sig: Take 1 tablet (325 mg total) by mouth daily. OTC    Dispense:  90 tablet    Refill:  3   ezetimibe-simvastatin (VYTORIN) 10-20 MG tablet    Sig: Take 1 tablet by mouth at bedtime.    Dispense:  90 tablet    Refill:  1   dapagliflozin propanediol (FARXIGA) 10 MG TABS tablet    Sig: TAKE 1 TABLET BY MOUTH BEFORE BREAKFAST    Dispense:  90 tablet     Refill:  1   Cholecalciferol (VITAMIN D3) 50 MCG (2000 UT) capsule    Sig: Take 1 capsule (2,000 Units total) by mouth daily. OTC    Dispense:  90 capsule    Refill:  3    Can use OTC gummies, gel caps, etc   metFORMIN (GLUCOPHAGE) 1000 MG tablet    Sig: Take 1 tablet (1,000 mg total) by mouth 2 (two) times daily with a meal.    Dispense:  180 tablet    Refill:  1    Please note increased dosing   omeprazole (PRILOSEC) 40 MG capsule    Sig: Take 1 capsule (40 mg total) by mouth daily.    Dispense:  90 capsule    Refill:  1   sertraline (ZOLOFT) 50 MG tablet    Sig: Take 1 tablet (50 mg total) by mouth daily.    Dispense:  90 tablet    Refill:  1    Return for previously scheduled, CPE, Brandon Knight PCP in august.   Total time spent:30 Minutes Time spent includes  review of chart, medications, test results, and follow up plan with the patient.   [place] Controlled Substance Database was reviewed by me.  This patient was seen by Jonetta Osgood, FNP-C in collaboration with Dr. Clayborn Bigness as a part of collaborative care agreement.   Coltrane Tugwell R. Valetta Fuller, MSN, FNP-C Internal medicine

## 2023-03-03 ENCOUNTER — Telehealth: Payer: Self-pay | Admitting: Nurse Practitioner

## 2023-03-03 NOTE — Telephone Encounter (Signed)
Received [facility] order form. Gave to AA for signature.-nm

## 2023-03-04 ENCOUNTER — Telehealth: Payer: Self-pay | Admitting: Nurse Practitioner

## 2023-03-04 NOTE — Telephone Encounter (Signed)
[facility] order signed. Faxed back; [phone]. To be scanned

## 2023-03-26 ENCOUNTER — Telehealth: Payer: Self-pay | Admitting: Nurse Practitioner

## 2023-03-26 NOTE — Telephone Encounter (Signed)
Received call from [place] with [name] stating patient has not received pap supplies yet. I verified order had been signed and faxed back to [facility] on 03/04/23. I spoke with Terri with [facility]. She stated repap department is currently working on the order. I notified Delorise Shiner; [phone]

## 2023-04-08 ENCOUNTER — Telehealth: Payer: Self-pay | Admitting: Nurse Practitioner

## 2023-04-08 ENCOUNTER — Encounter: Payer: Self-pay | Admitting: Nurse Practitioner

## 2023-04-08 ENCOUNTER — Ambulatory Visit: Payer: Medicare Other | Admitting: Nurse Practitioner

## 2023-04-08 VITALS — BP 112/64 | HR 95 | Temp 98.2°F | Resp 16 | Ht 64.0 in | Wt 188.4 lb

## 2023-04-08 DIAGNOSIS — E538 Deficiency of other specified B group vitamins: Secondary | ICD-10-CM

## 2023-04-08 DIAGNOSIS — G40909 Epilepsy, unspecified, not intractable, without status epilepticus: Secondary | ICD-10-CM | POA: Diagnosis not present

## 2023-04-08 DIAGNOSIS — Z79899 Other long term (current) drug therapy: Secondary | ICD-10-CM

## 2023-04-08 DIAGNOSIS — E1165 Type 2 diabetes mellitus with hyperglycemia: Secondary | ICD-10-CM

## 2023-04-08 DIAGNOSIS — Z0001 Encounter for general adult medical examination with abnormal findings: Secondary | ICD-10-CM | POA: Diagnosis not present

## 2023-04-08 DIAGNOSIS — Z5181 Encounter for therapeutic drug level monitoring: Secondary | ICD-10-CM

## 2023-04-08 DIAGNOSIS — E559 Vitamin D deficiency, unspecified: Secondary | ICD-10-CM

## 2023-04-08 MED ORDER — SERTRALINE HCL 50 MG PO TABS
50.0000 mg | ORAL_TABLET | Freq: Every day | ORAL | 1 refills | Status: AC
Start: 2023-04-08 — End: ?

## 2023-04-08 MED ORDER — POLYETHYLENE GLYCOL 3350 17 G PO PACK
17.0000 g | PACK | Freq: Every day | ORAL | 12 refills | Status: AC
Start: 2023-04-08 — End: ?

## 2023-04-08 MED ORDER — BENZONATATE 200 MG PO CAPS
200.0000 mg | ORAL_CAPSULE | Freq: Two times a day (BID) | ORAL | 5 refills | Status: AC | PRN
Start: 2023-04-08 — End: ?

## 2023-04-08 MED ORDER — OMEPRAZOLE 40 MG PO CPDR
40.0000 mg | DELAYED_RELEASE_CAPSULE | Freq: Every day | ORAL | 1 refills | Status: AC
Start: 2023-04-08 — End: ?

## 2023-04-08 MED ORDER — EZETIMIBE-SIMVASTATIN 10-20 MG PO TABS
1.0000 | ORAL_TABLET | Freq: Every day | ORAL | 1 refills | Status: AC
Start: 2023-04-08 — End: ?

## 2023-04-08 MED ORDER — INSULIN PEN NEEDLE 32G X 6 MM MISC
1.0000 | Freq: Every day | 1 refills | Status: AC
Start: 2023-04-08 — End: ?

## 2023-04-08 MED ORDER — PHENOBARBITAL 32.4 MG PO TABS
ORAL_TABLET | ORAL | 3 refills | Status: AC
Start: 2023-04-08 — End: ?

## 2023-04-08 MED ORDER — VITAMIN B-12 1000 MCG PO TABS
1000.0000 ug | ORAL_TABLET | Freq: Every day | ORAL | 1 refills | Status: AC
Start: 2023-04-08 — End: ?

## 2023-04-08 MED ORDER — METOPROLOL TARTRATE 25 MG PO TABS
25.0000 mg | ORAL_TABLET | Freq: Two times a day (BID) | ORAL | 1 refills | Status: AC
Start: 2023-04-08 — End: ?

## 2023-04-08 MED ORDER — PHENYTOIN SODIUM EXTENDED 100 MG PO CAPS
400.0000 mg | ORAL_CAPSULE | Freq: Every day | ORAL | 3 refills | Status: AC
Start: 2023-04-08 — End: ?

## 2023-04-08 MED ORDER — METFORMIN HCL 1000 MG PO TABS
1000.0000 mg | ORAL_TABLET | Freq: Two times a day (BID) | ORAL | 1 refills | Status: AC
Start: 2023-04-08 — End: ?

## 2023-04-08 MED ORDER — INSULIN GLARGINE (2 UNIT DIAL) 300 UNIT/ML ~~LOC~~ SOPN
10.0000 [IU] | PEN_INJECTOR | Freq: Every day | SUBCUTANEOUS | 1 refills | Status: AC
Start: 2023-04-08 — End: ?

## 2023-04-08 MED ORDER — FOLIC ACID 1 MG PO TABS
1.0000 mg | ORAL_TABLET | Freq: Every day | ORAL | 1 refills | Status: AC
Start: 2023-04-08 — End: ?

## 2023-04-08 MED ORDER — IRON 325 (65 FE) MG PO TABS
325.0000 mg | ORAL_TABLET | Freq: Every day | ORAL | 3 refills | Status: AC
Start: 2023-04-08 — End: ?

## 2023-04-08 MED ORDER — NYSTATIN-TRIAMCINOLONE 100000-0.1 UNIT/GM-% EX OINT
1.0000 | TOPICAL_OINTMENT | CUTANEOUS | 0 refills | Status: AC | PRN
Start: 2023-04-08 — End: ?

## 2023-04-08 MED ORDER — VITAMIN D3 50 MCG (2000 UT) PO CAPS
2000.0000 [IU] | ORAL_CAPSULE | Freq: Every day | ORAL | 3 refills | Status: AC
Start: 2023-04-08 — End: ?

## 2023-04-08 MED ORDER — DAPAGLIFLOZIN PROPANEDIOL 10 MG PO TABS
ORAL_TABLET | ORAL | 1 refills | Status: AC
Start: 2023-04-08 — End: ?

## 2023-04-08 NOTE — Progress Notes (Signed)
[facility] [address] [place], [place] [address]  Internal MEDICINE  Office Visit Note  Patient Name: Brandon Knight  [id number]  [id number]  Date of Service: 04/08/2023  Chief Complaint  Patient presents with   Diabetes   Gastroesophageal Reflux   Hypertension   Hyperlipidemia   Medicare Wellness    HPI Holly presents for an annual well visit and physical exam.  Well-appearing 57 y.o. male with hypertension, hyperlipidemia, diabetes, generalized anxiety disorder, seizure disorder, and an intellectual disability. He is accompanied by the driver today, the caregiver was unable to attend the appt.  His medication list was reviewed and the copy from the residential place. Routine CRC screening: cologuard due in 2026 Eye exam due in June 2025 foot exam: done today  Labs: due today, labs drawn in office today by the provider. New or worsening pain: none  Other concerns: none       04/08/2023    9:12 AM 04/02/2022    3:16 PM 03/29/2021    2:09 PM  MMSE - Mini Mental State Exam  Not completed: Unable to complete Unable to complete Unable to complete    Functional Status Survey:       07/30/2021    1:44 PM 11/28/2021   10:34 AM 04/02/2022    3:15 PM 11/08/2022   10:57 AM 04/08/2023    9:12 AM  Fall Risk  Falls in the past year? 0 0 0 0 0  Was there an injury with Fall?    0 0  Fall Risk Category Calculator    0 0  (RETIRED) Patient Fall Risk Level Low fall risk  Low fall risk    Patient at Risk for Falls Due to No Fall Risks  No Fall Risks No Fall Risks No Fall Risks  Fall risk Follow up Falls evaluation completed  Falls evaluation completed Falls evaluation completed Falls evaluation completed       04/08/2023    9:12 AM  Depression screen PHQ 2/9  Decreased Interest 0  Down, Depressed, Hopeless 0  PHQ - 2 Score 0      Current Medication: Outpatient Encounter Medications as of 04/08/2023  Medication Sig   benzonatate (TESSALON) 200 MG capsule Take 1  capsule (200 mg total) by mouth 2 (two) times daily as needed for cough.   Calcium Carb-Cholecalciferol 500-600 MG-UNIT TABS Take 1 tablet by mouth.   chlorhexidine (PERIDEX) 0.12 % solution Use as directed 15 mLs in the mouth or throat 2 (two) times daily.   glucose blood test strip Use 1 test strip on Monday, Wednesday and Friday to check glucose with glucose meter.   loratadine (CLARITIN) 10 MG tablet Take 10 mg by mouth daily.   LORazepam (ATIVAN) 0.5 MG tablet Take 0.5 mg by mouth 2 (two) times daily. Take 1 tab po BID prn   naproxen (EC NAPROSYN) 500 MG EC tablet Take 500 mg by mouth 2 (two) times daily with a meal.   thioridazine (MELLARIL) 25 MG tablet Take 25 mg by mouth daily. Take 1 tab po am given by psych   thioridazine (MELLARIL) 50 MG tablet Take 50 mg by mouth at bedtime. Take 1 tab qhs by psych   [DISCONTINUED] Cholecalciferol (VITAMIN D3) 50 MCG (2000 UT) capsule Take 1 capsule (2,000 Units total) by mouth daily. OTC   [DISCONTINUED] cyanocobalamin (VITAMIN B12) 1000 MCG tablet Take 1 tablet (1,000 mcg total) by mouth daily.   [DISCONTINUED] dapagliflozin propanediol (FARXIGA) 10 MG TABS tablet TAKE  1 TABLET BY MOUTH BEFORE BREAKFAST   [DISCONTINUED] ezetimibe-simvastatin (VYTORIN) 10-20 MG tablet Take 1 tablet by mouth at bedtime.   [DISCONTINUED] Ferrous Sulfate (IRON) 325 (65 Fe) MG TABS Take 1 tablet (325 mg total) by mouth daily. OTC   [DISCONTINUED] folic acid (FOLVITE) 1 MG tablet Take 1 mg by mouth daily.   [DISCONTINUED] insulin glargine, 2 Unit Dial, (TOUJEO MAX) 300 UNIT/ML Solostar Pen Inject 10 Units into the skin daily before breakfast.   [DISCONTINUED] Insulin Pen Needle 32G X 6 MM MISC 1 Device by Does not apply route daily.   [DISCONTINUED] metFORMIN (GLUCOPHAGE) 1000 MG tablet Take 1 tablet (1,000 mg total) by mouth 2 (two) times daily with a meal.   [DISCONTINUED] metoprolol tartrate (LOPRESSOR) 25 MG tablet Take 1 tablet (25 mg total) by mouth 2 (two) times  daily.   [DISCONTINUED] nystatin-triamcinolone ointment (MYCOLOG) Apply 1 Application topically as needed.   [DISCONTINUED] omeprazole (PRILOSEC) 40 MG capsule Take 1 capsule (40 mg total) by mouth daily.   [DISCONTINUED] PHENobarbital (LUMINAL) 32.4 MG tablet Take 4 tab QHS   [DISCONTINUED] phenytoin (DILANTIN) 100 MG ER capsule Take 4 capsules (400 mg total) by mouth at bedtime.   [DISCONTINUED] polyethylene glycol (MIRALAX / GLYCOLAX) packet Take 17 g by mouth daily.   [DISCONTINUED] sertraline (ZOLOFT) 50 MG tablet Take 1 tablet (50 mg total) by mouth daily.   Cholecalciferol (VITAMIN D3) 50 MCG (2000 UT) capsule Take 1 capsule (2,000 Units total) by mouth daily. OTC   cyanocobalamin (VITAMIN B12) 1000 MCG tablet Take 1 tablet (1,000 mcg total) by mouth daily.   dapagliflozin propanediol (FARXIGA) 10 MG TABS tablet TAKE 1 TABLET BY MOUTH BEFORE BREAKFAST   ezetimibe-simvastatin (VYTORIN) 10-20 MG tablet Take 1 tablet by mouth at bedtime.   Ferrous Sulfate (IRON) 325 (65 Fe) MG TABS Take 1 tablet (325 mg total) by mouth daily. OTC   folic acid (FOLVITE) 1 MG tablet Take 1 tablet (1 mg total) by mouth daily.   insulin glargine, 2 Unit Dial, (TOUJEO MAX) 300 UNIT/ML Solostar Pen Inject 10 Units into the skin daily before breakfast.   Insulin Pen Needle 32G X 6 MM MISC 1 Device by Does not apply route daily.   metFORMIN (GLUCOPHAGE) 1000 MG tablet Take 1 tablet (1,000 mg total) by mouth 2 (two) times daily with a meal.   metoprolol tartrate (LOPRESSOR) 25 MG tablet Take 1 tablet (25 mg total) by mouth 2 (two) times daily.   nystatin-triamcinolone ointment (MYCOLOG) Apply 1 Application topically as needed.   omeprazole (PRILOSEC) 40 MG capsule Take 1 capsule (40 mg total) by mouth daily.   PHENobarbital (LUMINAL) 32.4 MG tablet Take 4 tab QHS   phenytoin (DILANTIN) 100 MG ER capsule Take 4 capsules (400 mg total) by mouth at bedtime.   polyethylene glycol (MIRALAX / GLYCOLAX) 17 g packet Take 17  g by mouth daily.   sertraline (ZOLOFT) 50 MG tablet Take 1 tablet (50 mg total) by mouth daily.   No facility-administered encounter medications on file as of 04/08/2023.    Surgical History: Past Surgical History:  Procedure Laterality Date   NO PAST SURGERIES      Medical History: Past Medical History:  Diagnosis Date   Anxiety    Diabetes mellitus without complication (HCC)    GERD (gastroesophageal reflux disease)    Hyperlipidemia    Hypertension    Seizures (HCC)     Family History: Family History  Family history unknown: Yes    Social  History   Socioeconomic History   Marital status: Single    Spouse name: Not on file   Number of children: Not on file   Years of education: Not on file   Highest education level: Not on file  Occupational History   Not on file  Tobacco Use   Smoking status: Never   Smokeless tobacco: Never  Vaping Use   Vaping status: Never Used  Substance and Sexual Activity   Alcohol use: No   Drug use: No   Sexual activity: Not on file  Other Topics Concern   Not on file  Social History Narrative   Not on file   Social Determinants of Health   Financial Resource Strain: Not on file  Food Insecurity: Not on file  Transportation Needs: Not on file  Physical Activity: Not on file  Stress: Not on file  Social Connections: Not on file  Intimate Partner Violence: Not on file      Review of Systems  Constitutional:  Negative for activity change, appetite change, chills, fatigue, fever and unexpected weight change.  HENT: Negative.  Negative for congestion, ear pain, rhinorrhea, sore throat and trouble swallowing.   Eyes: Negative.   Respiratory: Negative.  Negative for cough, chest tightness, shortness of breath and wheezing.   Cardiovascular: Negative.  Negative for chest pain.  Gastrointestinal: Negative.  Negative for abdominal pain, blood in stool, constipation, diarrhea, nausea and vomiting.  Endocrine: Negative.    Genitourinary: Negative.  Negative for difficulty urinating, dysuria, frequency, hematuria and urgency.  Musculoskeletal: Negative.  Negative for arthralgias, back pain, joint swelling, myalgias and neck pain.  Skin: Negative.  Negative for rash and wound.  Allergic/Immunologic: Negative.  Negative for immunocompromised state.  Neurological: Negative.  Negative for dizziness, seizures, numbness and headaches.  Hematological: Negative.   Psychiatric/Behavioral:  Positive for agitation, behavioral problems and dysphoric mood. Negative for self-injury and suicidal ideas. The patient is nervous/anxious.     Vital Signs: BP 112/64   Pulse 95   Temp 98.2 F (36.8 C)   Resp 16   Ht 5\' 4"  (1.626 m)   Wt 188 lb 6.4 oz (85.5 kg)   SpO2 96%   BMI 32.34 kg/m    Physical Exam Vitals reviewed.  Constitutional:      General: He is not in acute distress.    Appearance: Normal appearance. He is well-developed. He is obese. He is not ill-appearing or toxic-appearing.  HENT:     Head: Normocephalic and atraumatic.     Right Ear: Tympanic membrane, ear canal and external ear normal.     Left Ear: Tympanic membrane, ear canal and external ear normal.     Nose: Nose normal.     Mouth/Throat:     Mouth: Mucous membranes are moist.     Pharynx: Oropharynx is clear. No oropharyngeal exudate or posterior oropharyngeal erythema.  Eyes:     Extraocular Movements: Extraocular movements intact.     Conjunctiva/sclera: Conjunctivae normal.     Pupils: Pupils are equal, round, and reactive to light.  Neck:     Vascular: No carotid bruit.  Cardiovascular:     Rate and Rhythm: Normal rate and regular rhythm.     Pulses: Normal pulses.          Dorsalis pedis pulses are 2+ on the right side and 2+ on the left side.       Posterior tibial pulses are 2+ on the right side and 2+ on the left side.  Heart sounds: Normal heart sounds. No murmur heard.    No friction rub. No gallop.  Pulmonary:      Effort: Pulmonary effort is normal. No respiratory distress.     Breath sounds: Normal breath sounds. No wheezing.  Abdominal:     General: Bowel sounds are normal. There is no distension.     Palpations: Abdomen is soft.     Tenderness: There is no abdominal tenderness. There is no guarding.  Musculoskeletal:     Cervical back: Normal range of motion and neck supple.     Right foot: Normal range of motion. No deformity, bunion or foot drop.     Left foot: Normal range of motion. No deformity, bunion or foot drop.  Feet:     Right foot:     Protective Sensation: 6 sites tested.  6 sites sensed.     Skin integrity: Skin integrity normal. No ulcer, blister, skin breakdown, erythema, warmth, callus, dry skin or fissure.     Toenail Condition: Right toenails are abnormally thick.     Left foot:     Protective Sensation: 6 sites tested.  6 sites sensed.     Skin integrity: Skin integrity normal. No ulcer, blister, skin breakdown, erythema, warmth, callus, dry skin or fissure.     Toenail Condition: Left toenails are abnormally thick.  Lymphadenopathy:     Cervical: No cervical adenopathy.  Skin:    General: Skin is warm and dry.     Capillary Refill: Capillary refill takes less than 2 seconds.  Neurological:     Mental Status: He is alert. Mental status is at baseline.     Cranial Nerves: No facial asymmetry.     Gait: Gait is intact. Gait normal.     Comments: Nonverbal, unable to assess orientation  Psychiatric:        Attention and Perception: He is inattentive. He does not perceive auditory or visual hallucinations.        Mood and Affect: Mood is not anxious, depressed or elated. Affect is flat. Affect is not blunt.        Speech: He is noncommunicative (nonverbal, grunting and some mumbling). Speech is delayed.        Behavior: Behavior is slowed and withdrawn. Behavior is not agitated (not agitated today but can become easily agitated and aggressive at times per caregiver report.),  hyperactive or combative. Behavior is cooperative.        Thought Content: Thought content is not delusional (unable to assess, patient only grunts, mumbles and sometimes answered yes or no.). Thought content does not include homicidal or suicidal ideation.        Cognition and Memory: Cognition is impaired.        Judgment: Judgment is impulsive (per caregiver report) and inappropriate (per caregiver report).        Assessment/Plan: 1. Encounter for routine adult health examination with abnormal findings Age-appropriate preventive screenings and vaccinations discussed, annual physical exam completed. Routine labs for health maintenance ordered and drawn in office today. PHM updated.  - CBC with Differential/Platelet - CMP14+EGFR - Lipid Profile - Phenobarbital level - Vitamin D (25 hydroxy) - B12 and Folate Panel - Hgb A1C w/o eAG  2. Uncontrolled type 2 diabetes mellitus with hyperglycemia (HCC) Routine labs drawn in office today. Continue medications as prescribed.  - dapagliflozin propanediol (FARXIGA) 10 MG TABS tablet; TAKE 1 TABLET BY MOUTH BEFORE BREAKFAST  Dispense: 90 tablet; Refill: 1 - insulin glargine, 2 Unit Dial, (TOUJEO  MAX) 300 UNIT/ML Solostar Pen; Inject 10 Units into the skin daily before breakfast.  Dispense: 9 mL; Refill: 1 - Insulin Pen Needle 32G X 6 MM MISC; 1 Device by Does not apply route daily.  Dispense: 100 each; Refill: 1 - metFORMIN (GLUCOPHAGE) 1000 MG tablet; Take 1 tablet (1,000 mg total) by mouth 2 (two) times daily with a meal.  Dispense: 180 tablet; Refill: 1 - CBC with Differential/Platelet - CMP14+EGFR - Lipid Profile - Phenobarbital level - Vitamin D (25 hydroxy) - B12 and Folate Panel - Hgb A1C w/o eAG  3. Seizure disorder (HCC) Routine labs drawn in office today. Continue phenobarbital and phenytoin as prescribed.  - PHENobarbital (LUMINAL) 32.4 MG tablet; Take 4 tab QHS  Dispense: 120 tablet; Refill: 3 - phenytoin (DILANTIN) 100 MG ER  capsule; Take 4 capsules (400 mg total) by mouth at bedtime.  Dispense: 360 capsule; Refill: 3 - CBC with Differential/Platelet - CMP14+EGFR - Lipid Profile - Phenobarbital level - Vitamin D (25 hydroxy) - B12 and Folate Panel  4. B12 deficiency B12 supplement refilled, continue as prescribed. Routine labs drawn in office today - cyanocobalamin (VITAMIN B12) 1000 MCG tablet; Take 1 tablet (1,000 mcg total) by mouth daily.  Dispense: 90 tablet; Refill: 1 - CBC with Differential/Platelet - CMP14+EGFR - Lipid Profile - Phenobarbital level - Vitamin D (25 hydroxy) - B12 and Folate Panel  5. Vitamin D deficiency Routine lab drawn - Vitamin D (25 hydroxy)  6. Therapeutic drug monitoring Routine lab drawn - Phenobarbital level  7. Encounter for medication review Medication list reviewed, updated and refills ordered  - benzonatate (TESSALON) 200 MG capsule; Take 1 capsule (200 mg total) by mouth 2 (two) times daily as needed for cough.  Dispense: 30 capsule; Refill: 5 - Cholecalciferol (VITAMIN D3) 50 MCG (2000 UT) capsule; Take 1 capsule (2,000 Units total) by mouth daily. OTC  Dispense: 90 capsule; Refill: 3 - cyanocobalamin (VITAMIN B12) 1000 MCG tablet; Take 1 tablet (1,000 mcg total) by mouth daily.  Dispense: 90 tablet; Refill: 1 - ezetimibe-simvastatin (VYTORIN) 10-20 MG tablet; Take 1 tablet by mouth at bedtime.  Dispense: 90 tablet; Refill: 1 - Ferrous Sulfate (IRON) 325 (65 Fe) MG TABS; Take 1 tablet (325 mg total) by mouth daily. OTC  Dispense: 90 tablet; Refill: 3 - folic acid (FOLVITE) 1 MG tablet; Take 1 tablet (1 mg total) by mouth daily.  Dispense: 90 tablet; Refill: 1 - metoprolol tartrate (LOPRESSOR) 25 MG tablet; Take 1 tablet (25 mg total) by mouth 2 (two) times daily.  Dispense: 270 tablet; Refill: 1 - nystatin-triamcinolone ointment (MYCOLOG); Apply 1 Application topically as needed.  Dispense: 30 g; Refill: 0 - omeprazole (PRILOSEC) 40 MG capsule; Take 1 capsule (40  mg total) by mouth daily.  Dispense: 90 capsule; Refill: 1 - polyethylene glycol (MIRALAX / GLYCOLAX) 17 g packet; Take 17 g by mouth daily.  Dispense: 14 each; Refill: 12 - sertraline (ZOLOFT) 50 MG tablet; Take 1 tablet (50 mg total) by mouth daily.  Dispense: 90 tablet; Refill: 1    General Counseling: Sadrac verbalizes understanding of the findings of todays visit and agrees with plan of treatment. I have discussed any further diagnostic evaluation that may be needed or ordered today. We also reviewed his medications today. he has been encouraged to call the office with any questions or concerns that should arise related to todays visit.    Orders Placed This Encounter  Procedures   CBC with Differential/Platelet   CMP14+EGFR  Lipid Profile   Phenobarbital level   Vitamin D (25 hydroxy)   B12 and Folate Panel    Meds ordered this encounter  Medications   benzonatate (TESSALON) 200 MG capsule    Sig: Take 1 capsule (200 mg total) by mouth 2 (two) times daily as needed for cough.    Dispense:  30 capsule    Refill:  5    As needed   Cholecalciferol (VITAMIN D3) 50 MCG (2000 UT) capsule    Sig: Take 1 capsule (2,000 Units total) by mouth daily. OTC    Dispense:  90 capsule    Refill:  3    Can use OTC gummies, gel caps, etc   cyanocobalamin (VITAMIN B12) 1000 MCG tablet    Sig: Take 1 tablet (1,000 mcg total) by mouth daily.    Dispense:  90 tablet    Refill:  1   dapagliflozin propanediol (FARXIGA) 10 MG TABS tablet    Sig: TAKE 1 TABLET BY MOUTH BEFORE BREAKFAST    Dispense:  90 tablet    Refill:  1   ezetimibe-simvastatin (VYTORIN) 10-20 MG tablet    Sig: Take 1 tablet by mouth at bedtime.    Dispense:  90 tablet    Refill:  1   Ferrous Sulfate (IRON) 325 (65 Fe) MG TABS    Sig: Take 1 tablet (325 mg total) by mouth daily. OTC    Dispense:  90 tablet    Refill:  3   folic acid (FOLVITE) 1 MG tablet    Sig: Take 1 tablet (1 mg total) by mouth daily.    Dispense:   90 tablet    Refill:  1   insulin glargine, 2 Unit Dial, (TOUJEO MAX) 300 UNIT/ML Solostar Pen    Sig: Inject 10 Units into the skin daily before breakfast.    Dispense:  9 mL    Refill:  1   Insulin Pen Needle 32G X 6 MM MISC    Sig: 1 Device by Does not apply route daily.    Dispense:  100 each    Refill:  1    Please send with toujeo insulin pens; dx code E11.65   metFORMIN (GLUCOPHAGE) 1000 MG tablet    Sig: Take 1 tablet (1,000 mg total) by mouth 2 (two) times daily with a meal.    Dispense:  180 tablet    Refill:  1    Please note increased dosing   metoprolol tartrate (LOPRESSOR) 25 MG tablet    Sig: Take 1 tablet (25 mg total) by mouth 2 (two) times daily.    Dispense:  270 tablet    Refill:  1   nystatin-triamcinolone ointment (MYCOLOG)    Sig: Apply 1 Application topically as needed.    Dispense:  30 g    Refill:  0   omeprazole (PRILOSEC) 40 MG capsule    Sig: Take 1 capsule (40 mg total) by mouth daily.    Dispense:  90 capsule    Refill:  1   PHENobarbital (LUMINAL) 32.4 MG tablet    Sig: Take 4 tab QHS    Dispense:  120 tablet    Refill:  3    For next fill   phenytoin (DILANTIN) 100 MG ER capsule    Sig: Take 4 capsules (400 mg total) by mouth at bedtime.    Dispense:  360 capsule    Refill:  3   polyethylene glycol (MIRALAX / GLYCOLAX) 17 g packet  Sig: Take 17 g by mouth daily.    Dispense:  14 each    Refill:  12   sertraline (ZOLOFT) 50 MG tablet    Sig: Take 1 tablet (50 mg total) by mouth daily.    Dispense:  90 tablet    Refill:  1    Return in about 6 months (around 10/09/2023) for F/U, Recheck A1C,  PCP.   Total time spent:30 Minutes Time spent includes review of chart, medications, test results, and follow up plan with the patient.   [place] Controlled Substance Database was reviewed by me.  This patient was seen by Sallyanne Kuster, FNP-C in collaboration with Dr. Beverely Risen as a part of collaborative care agreement.   R.  Tedd Sias, MSN, FNP-C Internal medicine

## 2023-04-08 NOTE — Telephone Encounter (Addendum)
Brandon Knight with [name] called again stating patient still has not received supplies. Terri with [facility] gave me tele [phone] ext [phone] for repap department. Was transferred 4 times, then line disconnected. I s/w Ashy w/ [facility]. He will check into this and call me back. I notified [name].-Toni S/w Ashly, he stated supplies were sent out to [facility] 03/09/23. He will have someone in office to call Brandon Knight directly-Toni

## 2023-04-10 ENCOUNTER — Telehealth: Payer: Self-pay | Admitting: Nurse Practitioner

## 2023-04-10 NOTE — Telephone Encounter (Addendum)
Received cpap supply order via email from Ashly with [facility]. Gave to [facility] for signature-Toni Order completed. Faxed back to [place]; [phone]. Scanned-Toni

## 2023-05-02 ENCOUNTER — Telehealth: Payer: Self-pay | Admitting: Nurse Practitioner

## 2023-05-02 NOTE — Telephone Encounter (Signed)
Received email from [place] stating she still has not heard from [facility] regarding patient's machine and/or supplies. I have her their telephone #-Toni

## 2023-05-15 ENCOUNTER — Telehealth: Payer: Self-pay | Admitting: Nurse Practitioner

## 2023-05-15 NOTE — Telephone Encounter (Signed)
Paperwork competed. Notified Delorise Shiner with patient's home facility. She will have someone come to p/u at front desk-Toni

## 2023-05-22 ENCOUNTER — Encounter: Payer: Self-pay | Admitting: Nurse Practitioner

## 2023-05-22 ENCOUNTER — Telehealth (INDEPENDENT_AMBULATORY_CARE_PROVIDER_SITE_OTHER): Payer: Medicare Other | Admitting: Nurse Practitioner

## 2023-05-22 VITALS — Resp 16 | Ht 64.0 in | Wt 193.4 lb

## 2023-05-22 DIAGNOSIS — J019 Acute sinusitis, unspecified: Secondary | ICD-10-CM

## 2023-05-22 MED ORDER — AZITHROMYCIN 250 MG PO TABS
ORAL_TABLET | ORAL | 0 refills | Status: AC
Start: 2023-05-22 — End: 2023-05-27

## 2023-05-22 MED ORDER — GUAIFENESIN-DM 100-10 MG/5ML PO SYRP
15.0000 mL | ORAL_SOLUTION | ORAL | 0 refills | Status: AC | PRN
Start: 2023-05-22 — End: ?

## 2023-05-22 NOTE — Progress Notes (Signed)
[facility] [address] [place], [place] [address]  Internal MEDICINE  Telephone Visit  Patient Name: Brandon Knight  [id number]  [id number]  Date of Service: 05/22/2023  I connected with the patient at 1230 by telephone and verified the patients identity using two identifiers.   I discussed the limitations, risks, security and privacy concerns of performing an evaluation and management service by telephone and the availability of in person appointments. I also discussed with the patient that there may be a patient responsible charge related to the service.  The patient expressed understanding and agrees to proceed.    Chief Complaint  Patient presents with   Telephone Screen    Coughing for 5 days.     HPI Zedric presents for a telehealth virtual visit for coughing since friday Negative for covid Chest congestion and cough present No fever or chills, or other symptoms per caregiver  Current Medication: Outpatient Encounter Medications as of 05/22/2023  Medication Sig   azithromycin (ZITHROMAX) 250 MG tablet Take 2 tablets on day 1, then 1 tablet daily on days 2 through 5   benzonatate (TESSALON) 200 MG capsule Take 1 capsule (200 mg total) by mouth 2 (two) times daily as needed for cough.   Calcium Carb-Cholecalciferol 500-600 MG-UNIT TABS Take 1 tablet by mouth.   chlorhexidine (PERIDEX) 0.12 % solution Use as directed 15 mLs in the mouth or throat 2 (two) times daily.   Cholecalciferol (VITAMIN D3) 50 MCG (2000 UT) capsule Take 1 capsule (2,000 Units total) by mouth daily. OTC   cyanocobalamin (VITAMIN B12) 1000 MCG tablet Take 1 tablet (1,000 mcg total) by mouth daily.   dapagliflozin propanediol (FARXIGA) 10 MG TABS tablet TAKE 1 TABLET BY MOUTH BEFORE BREAKFAST   ezetimibe-simvastatin (VYTORIN) 10-20 MG tablet Take 1 tablet by mouth at bedtime.   Ferrous Sulfate (IRON) 325 (65 Fe) MG TABS Take 1 tablet (325 mg total) by mouth daily. OTC   folic acid (FOLVITE)  1 MG tablet Take 1 tablet (1 mg total) by mouth daily.   glucose blood test strip Use 1 test strip on Monday, Wednesday and Friday to check glucose with glucose meter.   guaiFENesin-dextromethorphan (ROBITUSSIN DM) 100-10 MG/5ML syrup Take 15 mLs by mouth every 4 (four) hours as needed for cough.   insulin glargine, 2 Unit Dial, (TOUJEO MAX) 300 UNIT/ML Solostar Pen Inject 10 Units into the skin daily before breakfast.   Insulin Pen Needle 32G X 6 MM MISC 1 Device by Does not apply route daily.   loratadine (CLARITIN) 10 MG tablet Take 10 mg by mouth daily.   LORazepam (ATIVAN) 0.5 MG tablet Take 0.5 mg by mouth 2 (two) times daily. Take 1 tab po BID prn   metFORMIN (GLUCOPHAGE) 1000 MG tablet Take 1 tablet (1,000 mg total) by mouth 2 (two) times daily with a meal.   metoprolol tartrate (LOPRESSOR) 25 MG tablet Take 1 tablet (25 mg total) by mouth 2 (two) times daily.   naproxen (EC NAPROSYN) 500 MG EC tablet Take 500 mg by mouth 2 (two) times daily with a meal.   nystatin-triamcinolone ointment (MYCOLOG) Apply 1 Application topically as needed.   omeprazole (PRILOSEC) 40 MG capsule Take 1 capsule (40 mg total) by mouth daily.   PHENobarbital (LUMINAL) 32.4 MG tablet Take 4 tab QHS   phenytoin (DILANTIN) 100 MG ER capsule Take 4 capsules (400 mg total) by mouth at bedtime.   polyethylene glycol (MIRALAX / GLYCOLAX) 17 g packet Take 17 g  by mouth daily.   sertraline (ZOLOFT) 50 MG tablet Take 1 tablet (50 mg total) by mouth daily.   thioridazine (MELLARIL) 25 MG tablet Take 25 mg by mouth daily. Take 1 tab po am given by psych   thioridazine (MELLARIL) 50 MG tablet Take 50 mg by mouth at bedtime. Take 1 tab qhs by psych   No facility-administered encounter medications on file as of 05/22/2023.    Surgical History: Past Surgical History:  Procedure Laterality Date   NO PAST SURGERIES      Medical History: Past Medical History:  Diagnosis Date   Anxiety    Diabetes mellitus without  complication (HCC)    GERD (gastroesophageal reflux disease)    Hyperlipidemia    Hypertension    Seizures (HCC)     Family History: Family History  Family history unknown: Yes    Social History   Socioeconomic History   Marital status: Single    Spouse name: Not on file   Number of children: Not on file   Years of education: Not on file   Highest education level: Not on file  Occupational History   Not on file  Tobacco Use   Smoking status: Never   Smokeless tobacco: Never  Vaping Use   Vaping status: Never Used  Substance and Sexual Activity   Alcohol use: No   Drug use: No   Sexual activity: Not on file  Other Topics Concern   Not on file  Social History Narrative   Not on file   Social Determinants of Health   Financial Resource Strain: Not on file  Food Insecurity: Not on file  Transportation Needs: Not on file  Physical Activity: Not on file  Stress: Not on file  Social Connections: Not on file  Intimate Partner Violence: Not on file      Review of Systems  Constitutional:  Positive for fatigue.  HENT:  Positive for congestion.   Respiratory:  Positive for cough and chest tightness. Negative for shortness of breath and wheezing.   Cardiovascular: Negative.  Negative for chest pain and palpitations.  Neurological:  Positive for headaches.    Vital Signs: Resp 16   Ht 5\' 4"  (1.626 m)   Wt 193 lb 6.4 oz (87.7 kg)   BMI 33.20 kg/m    Observation/Objective: He is at baseline. No acute distress noted.     Assessment/Plan: 1. Acute non-recurrent sinusitis, unspecified location OTC cough syrup and a zpak prescribed.  - azithromycin (ZITHROMAX) 250 MG tablet; Take 2 tablets on day 1, then 1 tablet daily on days 2 through 5  Dispense: 6 tablet; Refill: 0 - guaiFENesin-dextromethorphan (ROBITUSSIN DM) 100-10 MG/5ML syrup; Take 15 mLs by mouth every 4 (four) hours as needed for cough.  Dispense: 236 mL; Refill: 0   General Counseling: Garrit  verbalizes understanding of the findings of today's phone visit and agrees with plan of treatment. I have discussed any further diagnostic evaluation that may be needed or ordered today. We also reviewed his medications today. he has been encouraged to call the office with any questions or concerns that should arise related to todays visit.  Return if symptoms worsen or fail to improve.   No orders of the defined types were placed in this encounter.   Meds ordered this encounter  Medications   azithromycin (ZITHROMAX) 250 MG tablet    Sig: Take 2 tablets on day 1, then 1 tablet daily on days 2 through 5    Dispense:  6 tablet    Refill:  0   guaiFENesin-dextromethorphan (ROBITUSSIN DM) 100-10 MG/5ML syrup    Sig: Take 15 mLs by mouth every 4 (four) hours as needed for cough.    Dispense:  236 mL    Refill:  0    Time spent:10 Minutes Time spent with patient included reviewing progress notes, labs, imaging studies, and discussing plan for follow up.  [place] Controlled Substance Database was reviewed by me for overdose risk score (ORS) if appropriate.  This patient was seen by Sallyanne Kuster, FNP-C in collaboration with Dr. Beverely Risen as a part of collaborative care agreement.  Kiearra Oyervides R. Tedd Sias, MSN, FNP-C Internal medicine

## 2023-05-23 ENCOUNTER — Encounter: Payer: Self-pay | Admitting: Nurse Practitioner

## 2023-06-03 NOTE — Progress Notes (Signed)
Labs are grossly normal, A1c is 5.9 which is slightly increased but still stable.  Cholesterol is normal Phenobarbital level is in therapeutic range.

## 2023-06-06 ENCOUNTER — Telehealth: Payer: Self-pay

## 2023-06-06 NOTE — Telephone Encounter (Signed)
-----   Message from [place] sent at 06/03/2023 11:31 AM EDT ----- Labs are grossly normal, A1c is 5.9 which is slightly increased but still stable.  Cholesterol is normal Phenobarbital level is in therapeutic range.

## 2023-06-06 NOTE — Telephone Encounter (Signed)
Pt caregiver advised labs in normal range

## 2023-10-09 ENCOUNTER — Ambulatory Visit: Payer: Medicare Other | Admitting: Nurse Practitioner

## 2023-10-16 ENCOUNTER — Telehealth: Payer: Self-pay

## 2023-10-16 NOTE — Telephone Encounter (Signed)
Spoke with caregiver that pt phenobarbital refills advised her that she need to contact her facility DR to do pres due to they are no longer our pt

## 2024-02-27 LAB — HM DIABETES EYE EXAM

## 2024-04-13 ENCOUNTER — Ambulatory Visit: Payer: Medicare Other | Admitting: Nurse Practitioner
